# Patient Record
Sex: Male | Born: 1940 | Race: White | Hispanic: No | Smoking: Former smoker
Health system: Southern US, Community
[De-identification: ages and names within clinical notes are randomized; demographics above are authoritative.]

## PROBLEM LIST (undated history)

## (undated) DIAGNOSIS — C44309 Unspecified malignant neoplasm of skin of other parts of face: Secondary | ICD-10-CM

## (undated) DIAGNOSIS — K5790 Diverticulosis of intestine, part unspecified, without perforation or abscess without bleeding: Secondary | ICD-10-CM

## (undated) DIAGNOSIS — E785 Hyperlipidemia, unspecified: Secondary | ICD-10-CM

## (undated) DIAGNOSIS — I10 Essential (primary) hypertension: Secondary | ICD-10-CM

## (undated) HISTORY — PX: MOHS SURGERY: SHX181

## (undated) HISTORY — DX: Diverticulosis of intestine, part unspecified, without perforation or abscess without bleeding: K57.90

## (undated) HISTORY — DX: Essential (primary) hypertension: I10

## (undated) HISTORY — DX: Unspecified malignant neoplasm of skin of other parts of face: C44.309

## (undated) HISTORY — PX: KNEE SURGERY: SHX244

## (undated) HISTORY — DX: Hyperlipidemia, unspecified: E78.5

## (undated) HISTORY — PX: MIDDLE EAR SURGERY: SHX713

## (undated) HISTORY — PX: TONSILLECTOMY AND ADENOIDECTOMY: SHX28

---

## 1997-08-23 ENCOUNTER — Ambulatory Visit (HOSPITAL_BASED_OUTPATIENT_CLINIC_OR_DEPARTMENT_OTHER): Admission: RE | Admit: 1997-08-23 | Discharge: 1997-08-23 | Payer: Self-pay | Admitting: Plastic Surgery

## 2001-08-04 ENCOUNTER — Emergency Department (HOSPITAL_COMMUNITY): Admission: EM | Admit: 2001-08-04 | Discharge: 2001-08-04 | Payer: Self-pay | Admitting: Emergency Medicine

## 2006-04-08 ENCOUNTER — Ambulatory Visit: Payer: Self-pay | Admitting: Gastroenterology

## 2006-04-29 ENCOUNTER — Encounter (INDEPENDENT_AMBULATORY_CARE_PROVIDER_SITE_OTHER): Payer: Self-pay | Admitting: Specialist

## 2006-04-29 ENCOUNTER — Ambulatory Visit: Payer: Self-pay | Admitting: Gastroenterology

## 2007-03-02 ENCOUNTER — Encounter: Admission: RE | Admit: 2007-03-02 | Discharge: 2007-03-02 | Payer: Self-pay | Admitting: Internal Medicine

## 2008-08-14 ENCOUNTER — Ambulatory Visit (HOSPITAL_COMMUNITY): Admission: RE | Admit: 2008-08-14 | Discharge: 2008-08-14 | Payer: Self-pay | Admitting: Internal Medicine

## 2010-02-19 ENCOUNTER — Encounter (INDEPENDENT_AMBULATORY_CARE_PROVIDER_SITE_OTHER): Payer: Self-pay | Admitting: Internal Medicine

## 2010-02-19 ENCOUNTER — Ambulatory Visit (HOSPITAL_COMMUNITY)
Admission: RE | Admit: 2010-02-19 | Discharge: 2010-02-19 | Payer: Self-pay | Source: Home / Self Care | Attending: Internal Medicine | Admitting: Internal Medicine

## 2010-04-11 ENCOUNTER — Other Ambulatory Visit: Payer: Self-pay | Admitting: Otolaryngology

## 2010-08-26 ENCOUNTER — Other Ambulatory Visit: Payer: Self-pay | Admitting: Dermatology

## 2010-12-01 ENCOUNTER — Other Ambulatory Visit: Payer: Self-pay | Admitting: Dermatology

## 2011-03-06 ENCOUNTER — Encounter: Payer: Self-pay | Admitting: Gastroenterology

## 2011-03-17 ENCOUNTER — Encounter: Payer: Self-pay | Admitting: Gastroenterology

## 2011-03-31 ENCOUNTER — Ambulatory Visit (AMBULATORY_SURGERY_CENTER): Payer: Managed Care, Other (non HMO)

## 2011-03-31 VITALS — Ht 69.0 in | Wt 155.8 lb

## 2011-03-31 DIAGNOSIS — Z86010 Personal history of colonic polyps: Secondary | ICD-10-CM

## 2011-03-31 DIAGNOSIS — Z1211 Encounter for screening for malignant neoplasm of colon: Secondary | ICD-10-CM

## 2011-03-31 MED ORDER — PEG-KCL-NACL-NASULF-NA ASC-C 100 G PO SOLR: 1.0000 | Freq: Once | ORAL | Status: AC

## 2011-04-14 ENCOUNTER — Encounter: Payer: Self-pay | Admitting: Gastroenterology

## 2011-04-14 ENCOUNTER — Ambulatory Visit (AMBULATORY_SURGERY_CENTER): Payer: Medicare Other | Admitting: Gastroenterology

## 2011-04-14 DIAGNOSIS — Z86010 Personal history of colonic polyps: Secondary | ICD-10-CM

## 2011-04-14 DIAGNOSIS — D126 Benign neoplasm of colon, unspecified: Secondary | ICD-10-CM

## 2011-04-14 DIAGNOSIS — Z1211 Encounter for screening for malignant neoplasm of colon: Secondary | ICD-10-CM

## 2011-04-14 DIAGNOSIS — K5730 Diverticulosis of large intestine without perforation or abscess without bleeding: Secondary | ICD-10-CM

## 2011-04-14 MED ORDER — SODIUM CHLORIDE 0.9 % IV SOLN: 500.0000 mL | INTRAVENOUS | Status: DC

## 2011-04-14 NOTE — Patient Instructions (Signed)
YOU HAD AN ENDOSCOPIC PROCEDURE TODAY AT THE [facility]: Refer to the procedure report that was given to you for any specific questions about what was found during the examination.  If the procedure report does not answer your questions, please call your gastroenterologist to clarify.  If you requested that your care partner not be given the details of your procedure findings, then the procedure report has been included in a sealed envelope for you to review at your convenience later.  YOU SHOULD EXPECT: Some feelings of bloating in the abdomen. Passage of more gas than usual.  Walking can help get rid of the air that was put into your GI tract during the procedure and reduce the bloating. If you had a lower endoscopy (such as a colonoscopy or flexible sigmoidoscopy) you may notice spotting of blood in your stool or on the toilet paper. If you underwent a bowel prep for your procedure, then you may not have a normal bowel movement for a few days.  DIET: Your first meal following the procedure should be a light meal and then it is ok to progress to your normal diet.  A half-sandwich or bowl of soup is an example of a good first meal.  Heavy or fried foods are harder to digest and may make you feel nauseous or bloated.  Likewise meals heavy in dairy and vegetables can cause extra gas to form and this can also increase the bloating.  Drink plenty of fluids but you should avoid alcoholic beverages for 24 hours.  ACTIVITY: Your care partner should take you home directly after the procedure.  You should plan to take it easy, moving slowly for the rest of the day.  You can resume normal activity the day after the procedure however you should NOT DRIVE or use heavy machinery for 24 hours (because of the sedation medicines used during the test).    SYMPTOMS TO REPORT IMMEDIATELY: A gastroenterologist can be reached at any hour.  During normal business hours, 8:30 AM to 5:00 PM Monday through Friday,  call [phone].  After hours and on weekends, please call the GI answering service at [phone] who will take a message and have the physician on call contact you.   Following lower endoscopy (colonoscopy or flexible sigmoidoscopy):  Excessive amounts of blood in the stool  Significant tenderness or worsening of abdominal pains  Swelling of the abdomen that is new, acute  Fever of 100F or higher  Following upper endoscopy (EGD)  Vomiting of blood or coffee ground material  New chest pain or pain under the shoulder blades  Painful or persistently difficult swallowing  New shortness of breath  Fever of 100F or higher  Black, tarry-looking stools  FOLLOW UP: If any biopsies were taken you will be contacted by phone or by letter within the next 1-3 weeks.  Call your gastroenterologist if you have not heard about the biopsies in 3 weeks.  Our staff will call the home number listed on your records the next business day following your procedure to check on you and address any questions or concerns that you may have at that time regarding the information given to you following your procedure. This is a courtesy call and so if there is no answer at the home number and we have not heard from you through the emergency physician on call, we will assume that you have returned to your regular daily activities without incident.  SIGNATURES/CONFIDENTIALITY: You and/or your care   partner have signed paperwork which will be entered into your electronic medical record.  These signatures attest to the fact that that the information above on your After Visit Summary has been reviewed and is understood.  Full responsibility of the confidentiality of this discharge information lies with you and/or your care-partner.    INFORMATION ON POLYPS,DIVERTICULOSIS,& HIGH FIBER DIET GIVEN TO YOU TODAY  

## 2011-04-14 NOTE — Op Note (Signed)
[facility] [address] [place], [place]  [address]  COLONOSCOPY PROCEDURE REPORT  PATIENT:  Bryan Rivera, Bryan Rivera  MR#:  [id number] BIRTHDATE:  21-May-1940, 70 yrs. old  GENDER:  male ENDOSCOPIST:  Barbette Hair. Arlyce Dice, MD REF. BY:  Nila Nephew, M.D. PROCEDURE DATE:  04/14/2011 PROCEDURE:  Colon with cold biopsy polypectomy ASA CLASS:  Class II INDICATIONS:  Screening, history of pre-cancerous (adenomatous) colon polyps polyps 2005, 2008 MEDICATIONS:   MAC sedation, administered by CRNA propofol 330mg IV  DESCRIPTION OF PROCEDURE:   After the risks benefits and alternatives of the procedure were thoroughly explained, informed consent was obtained.  Digital rectal exam was performed and revealed no abnormalities.   The LB [id number] endoscope was introduced through the anus and advanced to the cecum, which was identified by both the appendix and ileocecal valve, without limitations.  The quality of the prep was good, using MoviPrep. The instrument was then slowly withdrawn as the colon was fully examined. <<PROCEDUREIMAGES>>  FINDINGS:  A diminutive polyp was found in the descending colon. It was 2 mm in size. The polyp was removed using cold biopsy forceps (see image6).  Mild diverticulosis was found in the ascending colon (see image4).  Moderate diverticulosis was found in the sigmoid colon (see image7).  This was otherwise a normal examination of the colon (see image2 and image8).   Retroflexed views in the rectum revealed no abnormalities.    The time to cecum =  1) 2.75  minutes. The scope was then withdrawn in  1) 16.75  minutes from the cecum and the procedure completed. COMPLICATIONS:  None ENDOSCOPIC IMPRESSION: 1) 2 mm diminutive polyp in the descending colon 2) Mild diverticulosis in the ascending colon 3) Moderate diverticulosis in the sigmoid colon 4) Otherwise normal examination RECOMMENDATIONS: 1) If the polyp(s) removed today are proven to be  adenomatous (pre-cancerous) polyps, you will need a repeat colonoscopy in 5 years. Otherwise you should continue to follow colorectal cancer screening guidelines for "routine risk" patients with colonoscopy in 10 years. You will receive a letter within 1-2 weeks with the results of your biopsy as well as final recommendations. Please call my office if you have not received a letter after 3 weeks. REPEAT EXAM:   You will receive a letter from Dr. Arlyce Dice in 1-2 weeks, after reviewing the final pathology, with followup recommendations.  ______________________________ Barbette Hair Arlyce Dice, MD  CC:  n. eSIGNED:   Barbette Hair. Anaira Seay at 04/14/2011 10:10 AM  Doristine Devoid, [phone]

## 2011-04-14 NOTE — Progress Notes (Signed)
Patient did not experience any of the following events: a burn prior to discharge; a fall within the facility; wrong site/side/patient/procedure/implant event; or a hospital transfer or hospital admission upon discharge from the facility. (G8907) Patient did not have preoperative order for IV antibiotic SSI prophylaxis. (G8918)  

## 2011-04-14 NOTE — Progress Notes (Signed)
Pt bruised immediately when IV started.  Tissue remains soft.  IV infusing without any problems.  Will continue to watch closely.

## 2011-04-15 ENCOUNTER — Telehealth: Payer: Self-pay | Admitting: *Deleted

## 2011-04-15 NOTE — Telephone Encounter (Signed)
  Follow up Call-  Call back number 04/14/2011  Post procedure Call Back phone  # [phone]  Permission to leave phone message Yes     Patient questions:  Do you have a fever, pain , or abdominal swelling? no Pain Score  0 *  Have you tolerated food without any problems? yes  Have you been able to return to your normal activities? yes  Do you have any questions about your discharge instructions: Diet   no Medications  no Follow up visit  no  Do you have questions or concerns about your Care? no  Actions: * If pain score is 4 or above: No action needed, pain <4.

## 2011-04-21 ENCOUNTER — Encounter: Payer: Self-pay | Admitting: Gastroenterology

## 2011-07-21 ENCOUNTER — Encounter (HOSPITAL_COMMUNITY): Payer: Self-pay | Admitting: *Deleted

## 2011-07-21 ENCOUNTER — Emergency Department (HOSPITAL_COMMUNITY)
Admission: EM | Admit: 2011-07-21 | Discharge: 2011-07-21 | Disposition: A | Discharge status: Home / Self Care | Payer: Medicare Other | Attending: Emergency Medicine | Admitting: Emergency Medicine

## 2011-07-21 ENCOUNTER — Emergency Department (HOSPITAL_COMMUNITY): Payer: Medicare Other

## 2011-07-21 DIAGNOSIS — E119 Type 2 diabetes mellitus without complications: Secondary | ICD-10-CM | POA: Insufficient documentation

## 2011-07-21 DIAGNOSIS — Z79899 Other long term (current) drug therapy: Secondary | ICD-10-CM | POA: Insufficient documentation

## 2011-07-21 DIAGNOSIS — R112 Nausea with vomiting, unspecified: Secondary | ICD-10-CM | POA: Insufficient documentation

## 2011-07-21 DIAGNOSIS — R109 Unspecified abdominal pain: Secondary | ICD-10-CM | POA: Insufficient documentation

## 2011-07-21 DIAGNOSIS — N200 Calculus of kidney: Secondary | ICD-10-CM | POA: Insufficient documentation

## 2011-07-21 DIAGNOSIS — I10 Essential (primary) hypertension: Secondary | ICD-10-CM | POA: Insufficient documentation

## 2011-07-21 DIAGNOSIS — K8020 Calculus of gallbladder without cholecystitis without obstruction: Secondary | ICD-10-CM | POA: Insufficient documentation

## 2011-07-21 DIAGNOSIS — Z85828 Personal history of other malignant neoplasm of skin: Secondary | ICD-10-CM | POA: Insufficient documentation

## 2011-07-21 DIAGNOSIS — E785 Hyperlipidemia, unspecified: Secondary | ICD-10-CM | POA: Insufficient documentation

## 2011-07-21 LAB — COMPREHENSIVE METABOLIC PANEL
ALT: 30 U/L (ref 0–53)
BUN: 27 mg/dL — ABNORMAL HIGH (ref 6–23)
CO2: 20 mEq/L (ref 19–32)
Calcium: 9.7 mg/dL (ref 8.4–10.5)
Creatinine, Ser: 1.24 mg/dL (ref 0.50–1.35)
GFR calc Af Amer: 66 mL/min — ABNORMAL LOW (ref >90)
GFR calc non Af Amer: 57 mL/min — ABNORMAL LOW (ref >90)
Glucose, Bld: 214 mg/dL — ABNORMAL HIGH (ref 70–99)
Sodium: 138 mEq/L (ref 135–145)

## 2011-07-21 LAB — DIFFERENTIAL
Eosinophils Relative: 1 % (ref 0–5)
Lymphocytes Relative: 4 % — ABNORMAL LOW (ref 12–46)
Lymphs Abs: 0.5 10*3/uL — ABNORMAL LOW (ref 0.7–4.0)
Monocytes Absolute: 0.7 10*3/uL (ref 0.1–1.0)
Monocytes Relative: 6 % (ref 3–12)

## 2011-07-21 LAB — CBC
HCT: 46.1 % (ref 39.0–52.0)
Hemoglobin: 16.1 g/dL (ref 13.0–17.0)
MCH: 31.6 pg (ref 26.0–34.0)
MCV: 90.6 fL (ref 78.0–100.0)
RBC: 5.09 MIL/uL (ref 4.22–5.81)
WBC: 11.6 10*3/uL — ABNORMAL HIGH (ref 4.0–10.5)

## 2011-07-21 LAB — URINALYSIS, ROUTINE W REFLEX MICROSCOPIC
Glucose, UA: NEGATIVE mg/dL
Hgb urine dipstick: NEGATIVE
Specific Gravity, Urine: 1.023 (ref 1.005–1.030)

## 2011-07-21 LAB — URINE MICROSCOPIC-ADD ON

## 2011-07-21 MED ORDER — CIPROFLOXACIN HCL 500 MG PO TABS: 500.0000 mg | ORAL_TABLET | Freq: Two times a day (BID) | ORAL | Status: AC

## 2011-07-21 MED ORDER — NAPROXEN 500 MG PO TABS: 500.0000 mg | ORAL_TABLET | Freq: Two times a day (BID) | ORAL | Status: AC

## 2011-07-21 MED ORDER — HYDROCODONE-ACETAMINOPHEN 5-500 MG PO TABS: 1.0000 | ORAL_TABLET | Freq: Four times a day (QID) | ORAL | Status: AC | PRN

## 2011-07-21 MED ORDER — METRONIDAZOLE 500 MG PO TABS: 500.0000 mg | ORAL_TABLET | Freq: Two times a day (BID) | ORAL | Status: AC

## 2011-07-21 MED ORDER — ONDANSETRON HCL 4 MG/2ML IJ SOLN
4.0000 mg | Freq: Four times a day (QID) | INTRAMUSCULAR | Status: DC | PRN
  Administered 2011-07-21: 4 mg via INTRAVENOUS
  Filled 2011-07-21: qty 2

## 2011-07-21 MED ORDER — IOHEXOL 300 MG/ML  SOLN
100.0000 mL | Freq: Once | INTRAMUSCULAR | Status: AC | PRN
  Administered 2011-07-21: 100 mL via INTRAVENOUS

## 2011-07-21 MED ORDER — SODIUM CHLORIDE 0.9 % IV SOLN
Freq: Once | INTRAVENOUS | Status: AC
  Administered 2011-07-21: 04:00:00 via INTRAVENOUS

## 2011-07-21 MED ORDER — MORPHINE SULFATE 4 MG/ML IJ SOLN
4.0000 mg | INTRAMUSCULAR | Status: DC | PRN
  Administered 2011-07-21: 4 mg via INTRAVENOUS
  Filled 2011-07-21: qty 1

## 2011-07-21 NOTE — ED Notes (Signed)
abd pain with nv and diarrhea for 3-4 hours

## 2011-07-21 NOTE — Discharge Instructions (Signed)
You have been diagnosed with undifferentiated abdominal pain.  Abdominal pain can be caused by many things. Your caregiver evaluates the seriousness of your pain by an examination and possibly blood or urine tests and imaging (CT scan, x-rays, ultrasound). Many cases can be observed and treated at home after initial evaluation in the emergency department. Even though you are being discharged home, abdominal pain can be unpredictable. Therefore, you need a repeat exam if your pain does not resolve, returns, or worsens. Most patient's with abdominal pain do not need to be admitted to the hospital or have surgery, but serious problems like appendicitis and gallbladder attacks can start out as nonspecific pain. Many abdominal conditions cannot be diagnosed in 1 visit, so followup evaluations are very important.  In your case the CT scan showed a small portion of your bowel that was surrounded by some inflammatory changes. We are unsure what caused this inflammation at this time but it is likely related to either infection or inflammation. If you should develop severe or worsening pain, fever or vomiting he should return to the emergency department immediately.  Seek immediate medical attention if:  *The pain does not go away or becomes severe. *Temperature above 101 develops *Repeated vomiting occurs(multiple episodes) *The pain becomes localized to portions of the abdomen. The right side could possibly be appendicitis. In an adult, the left lower portion of the abdomen could be colitis or diverticulitis. *Blood is being passed in stools or vomit *Return also if you develop chest pain, difficulty breathing, dizziness or fainting, or become confused poorly responsive or inconsolable (young children).

## 2011-07-21 NOTE — ED Provider Notes (Signed)
History     CSN: [id number]  Arrival date & time 07/21/11  0118   First MD Initiated Contact with Patient 07/21/11 0325      Chief Complaint  Patient presents with  . Abdominal Pain    (Consider location/radiation/quality/duration/timing/severity/associated sxs/prior treatment) HPI Comments: 71 year old male with a history of diabetes, hypertension, diverticulosis who presents with a complaint of abdominal pain. He states this was acute in onset several hours ago, associated with nausea and vomiting but no diarrhea. He states that the pain comes in waves, is severe when it comes on, resolves spontaneously but then recurs. He denies any recent abdominal trauma, fevers, chills, changes in bowel habits. He does admit to having a upper respiratory illness last week which has completely resolved. He has been eating and drinking without difficulty for the last 24 hours until this illness started. He denies any history of abdominal surgery  Patient is a 71 y.o. male presenting with abdominal pain. The history is provided by the patient and the spouse.  Abdominal Pain The primary symptoms of the illness include abdominal pain.    Past Medical History  Diagnosis Date  . Diabetes mellitus   . Hyperlipidemia   . Hypertension   . 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 Diverticulosis   . Hemorrhoids     Past Surgical History  Procedure Date  . Middle ear surgery     multiple times  . Tonsillectomy and adenoidectomy   . Knee surgery     No family history on file.  History  Substance Use Topics  . Smoking status: Former Smoker    Types: Cigarettes    Quit date: 03/30/1986  . Smokeless tobacco: Never Used  . Alcohol Use: 3.5 oz/week    7 drink(s) per week      Review of Systems  Gastrointestinal: Positive for abdominal pain.  All other systems reviewed and are negative.    Allergies  Sulfa antibiotics  Home Medications    Current Outpatient Rx  Name Route Sig Dispense Refill  . CETIRIZINE HCL 10 MG PO TABS Oral Take 10 mg by mouth daily as needed. As needed for allergies.    Marland Kitchen CLOBETASOL PROPIONATE 0.05 % EX FOAM Topical Apply 1 application topically daily.     Marland Kitchen METFORMIN HCL 850 MG PO TABS Oral Take 850 mg by mouth 2 (two) times daily with a meal.    . OVER THE COUNTER MEDICATION Oral Take 1 tablet by mouth 2 (two) times daily as needed. Allergy Relief. As needed for allergy relief.    Marland Kitchen RAMIPRIL 10 MG PO CAPS Oral Take 10 mg by mouth daily.    Marland Kitchen SIMVASTATIN 40 MG PO TABS Oral Take 40 mg by mouth every evening.    Marland Kitchen CIPROFLOXACIN HCL 500 MG PO TABS Oral Take 1 tablet (500 mg total) by mouth every 12 (twelve) hours. 20 tablet 0  . HYDROCODONE-ACETAMINOPHEN 5-500 MG PO TABS Oral Take 1-2 tablets by mouth every 6 (six) hours as needed for pain. 15 tablet 0  . METRONIDAZOLE 500 MG PO TABS Oral Take 1 tablet (500 mg total) by mouth 2 (two) times daily. 20 tablet 0  . NAPROXEN 500 MG PO TABS Oral Take 1 tablet (500 mg total) by mouth 2 (two) times daily with a meal. 30 tablet 0    BP 96/60  Pulse 79  Temp(Src) 97.7 F (36.5 C) (Oral)  Resp 14  SpO2  97%  Physical Exam  Nursing note and vitals reviewed. Constitutional: He appears well-developed and well-nourished. No distress.  HENT:  Head: Normocephalic and atraumatic.  Mouth/Throat: Oropharynx is clear and moist. No oropharyngeal exudate.  Eyes: Conjunctivae and EOM are normal. Pupils are equal, round, and reactive to light. Right eye exhibits no discharge. Left eye exhibits no discharge. No scleral icterus.  Neck: Normal range of motion. Neck supple. No JVD present. No thyromegaly present.  Cardiovascular: Normal rate, regular rhythm, normal heart sounds and intact distal pulses.  Exam reveals no gallop and no friction rub.   No murmur heard. Pulmonary/Chest: Effort normal and breath sounds normal. No respiratory distress. He has no wheezes. He has no  rales.  Abdominal: Soft. Bowel sounds are normal. He exhibits no distension and no mass. There is no tenderness.  Musculoskeletal: Normal range of motion. He exhibits no edema and no tenderness.  Lymphadenopathy:    He has no cervical adenopathy.  Neurological: He is alert. Coordination normal.  Skin: Skin is warm and dry. No rash noted. No erythema.  Psychiatric: He has a normal mood and affect. His behavior is normal.    ED Course  Procedures (including critical care time)  Labs Reviewed  URINALYSIS, ROUTINE W REFLEX MICROSCOPIC - Abnormal; Notable for the following:    APPearance CLOUDY (*)    Bilirubin Urine MODERATE (*)    Ketones, ur 40 (*)    Protein, ur 30 (*)    All other components within normal limits  CBC - Abnormal; Notable for the following:    WBC 11.6 (*)    All other components within normal limits  DIFFERENTIAL - Abnormal; Notable for the following:    Neutrophils Relative 89 (*)    Neutro Abs 10.3 (*)    Lymphocytes Relative 4 (*)    Lymphs Abs 0.5 (*)    All other components within normal limits  COMPREHENSIVE METABOLIC PANEL - Abnormal; Notable for the following:    Potassium 5.8 (*)    Glucose, Bld 214 (*)    BUN 27 (*)    AST 40 (*)    GFR calc non Af Amer 57 (*)    GFR calc Af Amer 66 (*)    All other components within normal limits  URINE MICROSCOPIC-ADD ON - Abnormal; Notable for the following:    Bacteria, UA FEW (*)    Casts HYALINE CASTS (*)    Crystals CA OXALATE CRYSTALS (*)    All other components within normal limits  LIPASE, BLOOD   Ct Abdomen Pelvis W Contrast  07/21/2011  *RADIOLOGY REPORT*  Clinical Data: Upper abdominal pain.  CT ABDOMEN AND PELVIS WITH CONTRAST  Technique:  Multidetector CT imaging of the abdomen and pelvis was performed following the standard protocol during bolus administration of intravenous contrast.  Contrast: OMNIPAQUE IOHEXOL 300 MG/ML  SOLN  Comparison: None.  Findings: Limited images through the lung  bases demonstrate no significant appreciable abnormality. The heart size is within normal limits. No pleural or pericardial effusion.  Coronary artery calcification.  Diffuse low attenuation of the liver is the most in keeping with fatty infiltration.  Small layering gallstones.  No gallbladder wall thickening or pericholecystic fluid.  No biliary ductal dilatation.  Unremarkable spleen and adrenal glands.  Punctate calcific density along the tail of pancreas may reflect a vascular calcification or sequelae of prior pancreatitis.  There is prominence of the main pancreatic duct within the head, measuring up to 5 mm.  No obstructing  lesion is identified.  There is a small duodenal diverticulum.  Multiple bilateral nonobstructing renal stones.  No hydronephrosis or hydroureter.  Colonic diverticulosis.  No CT evidence for colitis or diverticulitis.  No bowel obstruction.  There are a few loops of jejunum with mild mesenteric edema/fat stranding.  No overt bowel wall thickening (see coronal image 24 as index).  No free intraperitoneal air.  No lymphadenopathy.  Small amount of free fluid collecting perihepatic and dependently within the pelvis.  Circumferential bladder wall thickening is nonspecific given incomplete distension. Small fat containing left inguinal hernia.  Advanced atherosclerotic disease of the aorta and branch vessels.  Multilevel degenerative changes of the imaged spine. No acute or aggressive appearing osseous lesion. L5 pars defects.  IMPRESSION:  Mild mesenteric stranding/interloop fluid abutting a decompressed loop of jejunum.  A nonspecific enteritis is not excluded (infectious, inflammatory, ischemic etiologies as considerations).  Hepatic steatosis.  Small amount of perihepatic and dependent pelvic fluid.  Circumferential bladder wall thickening is nonspecific given incomplete distension.  Correlate with urinalysis to exclude cystitis.  Nonobstructing renal stones.  Small gallstones.  No  gallbladder wall thickening or pericholecystic fluid.  Nonspecific pancreatic ductal prominence within the head.  No obstructing lesion identified.  Correlate with prior imaging if available.  Alternatively, can be further characterized with ERCP or MRCP.  Original Report Authenticated By: Waneta Martins, M.D.     1. Abdominal pain       MDM  On my initial examination there does not appear to be any tenderness. Will obtain EKG, CT scan of the abdomen and pelvis, lab work at this time shows a leukocytosis of 11,600, potassium of 5.8 and liver function tests which are normal. Urinalysis shows ketones likely related to dehydration but no glucosuria.  ED ECG REPORT   Date: 07/21/2011 I have personally reviewed the EKG  Rate: 77  Rhythm: normal sinus rhythm  QRS Axis: normal  Intervals: normal  ST/T Wave abnormalities: normal  Conduction Disutrbances:none  Narrative Interpretation:   Old EKG Reviewed: none available  I have reviewed the patient's labs with him, his CT scan and have informed of all his results. He states that he has no pain since the initial evaluation. The CT scan shows a small amount of inflammation surrounding part of the bowel and after repeat evaluation showing no abdominal tenderness, overall reassuring laboratory data including a white blood cell count of 11,600.  The patient has been given IV fluids for his urinalysis was 40+ ketones. He states that he feels stable for discharge, he is able to express his understanding of indications for followup.  Discharge Prescriptions include:  Naprosyn Hydrocodone Ciprofloxacin Flagyl   Vida Roller, MD 07/21/11 [phone]

## 2011-12-22 ENCOUNTER — Ambulatory Visit: Payer: Medicare Other | Admitting: Psychology

## 2012-04-13 ENCOUNTER — Other Ambulatory Visit: Payer: Self-pay | Admitting: Dermatology

## 2012-05-26 ENCOUNTER — Other Ambulatory Visit: Payer: Self-pay | Admitting: Dermatology

## 2012-10-27 ENCOUNTER — Other Ambulatory Visit: Payer: Self-pay | Admitting: Dermatology

## 2013-03-03 ENCOUNTER — Encounter (INDEPENDENT_AMBULATORY_CARE_PROVIDER_SITE_OTHER): Payer: Self-pay | Admitting: General Surgery

## 2013-03-03 ENCOUNTER — Ambulatory Visit (INDEPENDENT_AMBULATORY_CARE_PROVIDER_SITE_OTHER): Payer: Medicare Other | Admitting: General Surgery

## 2013-03-03 VITALS — BP 140/80 | HR 120 | Temp 98.2°F | Resp 14 | Ht 69.0 in | Wt 162.4 lb

## 2013-03-03 DIAGNOSIS — K8020 Calculus of gallbladder without cholecystitis without obstruction: Secondary | ICD-10-CM

## 2013-03-03 DIAGNOSIS — R7989 Other specified abnormal findings of blood chemistry: Secondary | ICD-10-CM

## 2013-03-03 DIAGNOSIS — R945 Abnormal results of liver function studies: Secondary | ICD-10-CM

## 2013-03-03 NOTE — Progress Notes (Signed)
Subjective:   abdominal pain, gallstones  Patient ID: Bryan Rivera, male   DOB: 12/04/1940, 72 y.o.   MRN: [id number]  HPI Patient is a very pleasant 72-year-old male referred by Dr. Arthur Green. He was in his usual state of health until one week ago when while driving to [place] to see his daughter he developed the onset of upper abdominal pressure which over the next hour or so developed and a significant pressure-like diffuse upper abdominal pain. This was associated with nausea and vomiting. His symptoms worsened and they went to the emergency room in [place]. There he had a workup including lab work, CT scan and ultrasound of the abdomen. I have all this to review today.CT scan showed some fatty infiltration of the liver, mildly prominent common bile duct at 9 mm without filling defects. Ultrasound showed gallbladder sludge and probable tiny stones without biliary ductal dilatation. Of note is the patient had a similar episode of pain in 2013 and CT scan of the abdomen and pelvis was done at that time revealing small gallstones. His pain resolved quickly in the emergency room one week ago after pain medication and since then he has felt well with no abdominal pain or nausea, fever, jaundice or other complaints.  Past Medical History  Diagnosis Date  . Diabetes mellitus   . Hyperlipidemia   . Hypertension   . 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 Diverticulosis   . Hemorrhoids    Past Surgical History  Procedure Laterality Date  . Middle ear surgery      multiple times  . Tonsillectomy and adenoidectomy    . Knee surgery     Current Outpatient Prescriptions  Medication Sig Dispense Refill  . cetirizine (ZYRTEC) 10 MG tablet Take 10 mg by mouth daily as needed. As needed for allergies.      . clobetasol (OLUX) 0.05 % topical foam Apply 1 application topically daily.       . metFORMIN (GLUCOPHAGE) 850 MG tablet Take 850 mg by mouth 2 (two)  times daily with a meal.      . ONGLYZA 5 MG TABS tablet       . OVER THE COUNTER MEDICATION Take 1 tablet by mouth 2 (two) times daily as needed. Allergy Relief. As needed for allergy relief.      . ramipril (ALTACE) 10 MG capsule Take 10 mg by mouth daily.      . simvastatin (ZOCOR) 40 MG tablet Take 40 mg by mouth every evening.      . HYDROcodone-acetaminophen (NORCO/VICODIN) 5-325 MG per tablet       . ondansetron (ZOFRAN-ODT) 4 MG disintegrating tablet        No current facility-administered medications for this visit.   Allergies  Allergen Reactions  . Sulfa Antibiotics Swelling    Review of Systems Gen.: No fever or chills or malaise Lungs: Denies shortness of breath cough wheezing Cardiac: Denies chest pain, palpitations or history of heart disease Abdomen: As above GU: No hematuria Musculoskeletal: Chronic joint pain and arthritis    Objective:   Physical Exam BP 140/80  Pulse 120  Temp(Src) 98.2 F (36.8 C) (Temporal)  Resp 14  Ht 5' 9" (1.753 m)  Wt 162 lb 6.4 oz (73.664 kg)  BMI 23.97 kg/m2 General: Alert,  thin, slightly anxious Caucasian male in no distress Skin: Warm and dry. Mild papular rash over her trunk. HEENT: No palpable masses or   thyromegaly. Sclera nonicteric. Pupils equal round and reactive. Oropharynx clear. Lymph nodes: No cervical, supraclavicular, or inguinal nodes palpable. Lungs: Breath sounds clear and equal without increased work of breathing Cardiovascular: Regular rate and rhythm without murmur. No JVD or edema. Peripheral pulses intact. Abdomen: Nondistended. Soft and nontender. No masses palpable. No organomegaly. No palpable hernias. Extremities: No edema or joint swelling or deformity. No chronic venous stasis changes. Neurologic: Alert and fully oriented. Gait normal.    Assessment:     Recent episode of severe epigastric abdominal pain and vomiting. He also had a similar episode in 2013. He has small gallstones and chemical  evidence of gallstone pancreatitis with mildly elevated LFTs. He is currently in tightly asymptomatic. I suspect he passed a common bile duct stone. I have recommended proceeding with laparoscopic cholecystectomy with cholangiogram to prevent further symptoms and prevent complications from his gallstones.I discussed the procedure in detail.  The patient was given Neurosurgeon.  We discussed the risks and benefits of a laparoscopic cholecystectomy and possible cholangiogram including, but not limited to bleeding, infection, injury to surrounding structures such as the intestine or liver, bile leak, retained gallstones, need to convert to an open procedure, prolonged diarrhea, blood clots such as  DVT, common bile duct injury, anesthesia risks, and possible need for additional procedures.  The likelihood of improvement in symptoms and return to the patient's normal status is good. We discussed the typical post-operative recovery course.     Plan:     Laparoscopic cholecystectomy with cholangiogram under general anesthesia likely as an outpatient. Repeat LFTs and lipase preoperatively.

## 2013-03-03 NOTE — Patient Instructions (Signed)
Cholelithiasis °Cholelithiasis (also called gallstones) is a form of gallbladder disease in which gallstones form in your gallbladder. The gallbladder is an organ that stores bile made in the liver, which helps digest fats. Gallstones begin as small crystals and slowly grow into stones. Gallstone pain occurs when the gallbladder spasms and a gallstone is blocking the duct. Pain can also occur when a stone passes out of the duct.  °RISK FACTORS °· Being male.   °· Having multiple pregnancies. Health care providers sometimes advise removing diseased gallbladders before future pregnancies.   °· Being obese. °· Eating a diet heavy in fried foods and fat.   °· Being older than 60 years and increasing age.   °· Prolonged use of medicines containing male hormones.   °· Having diabetes mellitus.   °· Rapidly losing weight.   °· Having a family history of gallstones (heredity).   °SYMPTOMS °· Nausea.   °· Vomiting. °· Abdominal pain.   °· Yellowing of the skin (jaundice).   °· Sudden pain. It may persist from several minutes to several hours. °· Fever.   °· Tenderness to the touch.  °In some cases, when gallstones do not move into the bile duct, people have no pain or symptoms. These are called "silent" gallstones.  °TREATMENT °Silent gallstones do not need treatment. In severe cases, emergency surgery may be required. Options for treatment include: °· Surgery to remove the gallbladder. This is the most common treatment. °· Medicines. These do not always work and may take 6 12 months or more to work. °· Shock wave treatment (extracorporeal biliary lithotripsy). In this treatment an ultrasound machine sends shock waves to the gallbladder to break gallstones into smaller pieces that can pass into the intestines or be dissolved by medicine. °HOME CARE INSTRUCTIONS  °· Only take over-the-counter or prescription medicines for pain, discomfort, or fever as directed by your health care provider.   °· Follow a low-fat diet until  seen again by your health care provider. Fat causes the gallbladder to contract, which can result in pain.   °· Follow up with your health care provider as directed. Attacks are almost always recurrent and surgery is usually required for permanent treatment.   °SEEK IMMEDIATE MEDICAL CARE IF:  °· Your pain increases and is not controlled by medicines.   °· You have a fever or persistent symptoms for more than 2 3 days.   °· You have a fever and your symptoms suddenly get worse.   °· You have persistent nausea and vomiting.   °MAKE SURE YOU:  °· Understand these instructions. °· Will watch your condition. °· Will get help right away if you are not doing well or get worse. °Document Released: 01/22/2005 Document Revised: 09/28/2012 Document Reviewed: 07/20/2012 °Exit[employer]® Patient Information ©2014 [employer]. ° °

## 2013-03-06 ENCOUNTER — Encounter (HOSPITAL_COMMUNITY): Payer: Self-pay | Admitting: *Deleted

## 2013-03-06 ENCOUNTER — Telehealth (INDEPENDENT_AMBULATORY_CARE_PROVIDER_SITE_OTHER): Payer: Self-pay | Admitting: General Surgery

## 2013-03-06 ENCOUNTER — Encounter (INDEPENDENT_AMBULATORY_CARE_PROVIDER_SITE_OTHER): Payer: Self-pay

## 2013-03-06 ENCOUNTER — Encounter (HOSPITAL_COMMUNITY): Payer: Self-pay | Admitting: Pharmacy Technician

## 2013-03-06 NOTE — Telephone Encounter (Signed)
Pt called in to ask about how soon he can drive following his lap chole.  Instructed pt to count on 2 weeks at a minimum to drive again.  He must meet 2 important criteria to drive:  1) not taking any narcotics for pain, as that is DUI, and 2) must wear a seat belt for safety.  Also reminded him not to push, pull, lift or carry anything heavy (>15 lbs) during this period as well.  He understands all.

## 2013-03-07 ENCOUNTER — Encounter (HOSPITAL_COMMUNITY): Payer: Medicare Other | Admitting: Anesthesiology

## 2013-03-07 ENCOUNTER — Ambulatory Visit (HOSPITAL_COMMUNITY): Payer: Medicare Other | Admitting: Anesthesiology

## 2013-03-07 ENCOUNTER — Ambulatory Visit (HOSPITAL_COMMUNITY): Payer: Medicare Other

## 2013-03-07 ENCOUNTER — Encounter (HOSPITAL_COMMUNITY)
Admission: RE | Disposition: A | Discharge status: Home / Self Care | Payer: Self-pay | Source: Ambulatory Visit | Attending: General Surgery

## 2013-03-07 ENCOUNTER — Observation Stay (HOSPITAL_COMMUNITY)
Admission: RE | Admit: 2013-03-07 | Discharge: 2013-03-08 | Disposition: A | Discharge status: Home / Self Care | Payer: Medicare Other | Source: Ambulatory Visit | Attending: General Surgery | Admitting: General Surgery

## 2013-03-07 ENCOUNTER — Encounter (HOSPITAL_COMMUNITY): Payer: Self-pay | Admitting: *Deleted

## 2013-03-07 DIAGNOSIS — E785 Hyperlipidemia, unspecified: Secondary | ICD-10-CM | POA: Insufficient documentation

## 2013-03-07 DIAGNOSIS — I10 Essential (primary) hypertension: Secondary | ICD-10-CM | POA: Insufficient documentation

## 2013-03-07 DIAGNOSIS — K8020 Calculus of gallbladder without cholecystitis without obstruction: Principal | ICD-10-CM | POA: Insufficient documentation

## 2013-03-07 DIAGNOSIS — K8010 Calculus of gallbladder with chronic cholecystitis without obstruction: Secondary | ICD-10-CM

## 2013-03-07 DIAGNOSIS — R7401 Elevation of levels of liver transaminase levels: Secondary | ICD-10-CM | POA: Insufficient documentation

## 2013-03-07 DIAGNOSIS — E119 Type 2 diabetes mellitus without complications: Secondary | ICD-10-CM | POA: Insufficient documentation

## 2013-03-07 DIAGNOSIS — K859 Acute pancreatitis without necrosis or infection, unspecified: Secondary | ICD-10-CM | POA: Insufficient documentation

## 2013-03-07 DIAGNOSIS — Z87891 Personal history of nicotine dependence: Secondary | ICD-10-CM | POA: Insufficient documentation

## 2013-03-07 DIAGNOSIS — Z85828 Personal history of other malignant neoplasm of skin: Secondary | ICD-10-CM | POA: Insufficient documentation

## 2013-03-07 DIAGNOSIS — R7402 Elevation of levels of lactic acid dehydrogenase (LDH): Secondary | ICD-10-CM | POA: Insufficient documentation

## 2013-03-07 DIAGNOSIS — M129 Arthropathy, unspecified: Secondary | ICD-10-CM | POA: Insufficient documentation

## 2013-03-07 DIAGNOSIS — Z79899 Other long term (current) drug therapy: Secondary | ICD-10-CM | POA: Insufficient documentation

## 2013-03-07 DIAGNOSIS — R740 Nonspecific elevation of levels of transaminase and lactic acid dehydrogenase [LDH]: Secondary | ICD-10-CM

## 2013-03-07 HISTORY — PX: CHOLECYSTECTOMY: SHX55

## 2013-03-07 LAB — CBC WITH DIFFERENTIAL/PLATELET
Basophils Absolute: 0 10*3/uL (ref 0.0–0.1)
Basophils Relative: 1 % (ref 0–1)
EOS ABS: 0.2 10*3/uL (ref 0.0–0.7)
Eosinophils Relative: 4 % (ref 0–5)
HCT: 39.9 % (ref 39.0–52.0)
HEMOGLOBIN: 13.8 g/dL (ref 13.0–17.0)
Lymphocytes Relative: 32 % (ref 12–46)
Lymphs Abs: 1.5 10*3/uL (ref 0.7–4.0)
MCH: 32.1 pg (ref 26.0–34.0)
MCHC: 34.6 g/dL (ref 30.0–36.0)
MCV: 92.8 fL (ref 78.0–100.0)
MONOS PCT: 12 % (ref 3–12)
Monocytes Absolute: 0.6 10*3/uL (ref 0.1–1.0)
NEUTROS PCT: 52 % (ref 43–77)
Neutro Abs: 2.5 10*3/uL (ref 1.7–7.7)
PLATELETS: 220 10*3/uL (ref 150–400)
RBC: 4.3 MIL/uL (ref 4.22–5.81)
RDW: 13 % (ref 11.5–15.5)
WBC: 4.8 10*3/uL (ref 4.0–10.5)

## 2013-03-07 LAB — COMPREHENSIVE METABOLIC PANEL
ALK PHOS: 58 U/L (ref 39–117)
ALT: 52 U/L (ref 0–53)
AST: 63 U/L — ABNORMAL HIGH (ref 0–37)
Albumin: 4 g/dL (ref 3.5–5.2)
BILIRUBIN TOTAL: 0.5 mg/dL (ref 0.3–1.2)
BUN: 15 mg/dL (ref 6–23)
CHLORIDE: 102 meq/L (ref 96–112)
CO2: 18 mEq/L — ABNORMAL LOW (ref 19–32)
Calcium: 9.2 mg/dL (ref 8.4–10.5)
Creatinine, Ser: 1.23 mg/dL (ref 0.50–1.35)
GFR calc non Af Amer: 57 mL/min — ABNORMAL LOW (ref >90)
GFR, EST AFRICAN AMERICAN: 66 mL/min — AB (ref >90)
Glucose, Bld: 134 mg/dL — ABNORMAL HIGH (ref 70–99)
POTASSIUM: 4.5 meq/L (ref 3.7–5.3)
SODIUM: 138 meq/L (ref 137–147)
TOTAL PROTEIN: 7 g/dL (ref 6.0–8.3)

## 2013-03-07 LAB — LIPASE, BLOOD: Lipase: 592 U/L — ABNORMAL HIGH (ref 11–59)

## 2013-03-07 LAB — GLUCOSE, CAPILLARY
GLUCOSE-CAPILLARY: 171 mg/dL — AB (ref 70–99)
Glucose-Capillary: 161 mg/dL — ABNORMAL HIGH (ref 70–99)

## 2013-03-07 SURGERY — LAPAROSCOPIC CHOLECYSTECTOMY WITH INTRAOPERATIVE CHOLANGIOGRAM
Anesthesia: General | Site: Abdomen

## 2013-03-07 MED ORDER — FENTANYL CITRATE 0.05 MG/ML IJ SOLN
INTRAMUSCULAR | Status: DC | PRN
  Administered 2013-03-07 (×2): 50 ug via INTRAVENOUS
  Administered 2013-03-07: 100 ug via INTRAVENOUS
  Administered 2013-03-07: 50 ug via INTRAVENOUS
  Administered 2013-03-07: 100 ug via INTRAVENOUS
  Administered 2013-03-07 (×2): 50 ug via INTRAVENOUS
  Administered 2013-03-07: 100 ug via INTRAVENOUS

## 2013-03-07 MED ORDER — LABETALOL HCL 5 MG/ML IV SOLN
INTRAVENOUS | Status: AC
  Filled 2013-03-07: qty 4

## 2013-03-07 MED ORDER — PROPOFOL 10 MG/ML IV BOLUS
INTRAVENOUS | Status: DC | PRN
  Administered 2013-03-07: 20 mg via INTRAVENOUS
  Administered 2013-03-07: 180 mg via INTRAVENOUS

## 2013-03-07 MED ORDER — LACTATED RINGERS IV SOLN
INTRAVENOUS | Status: DC | PRN
  Administered 2013-03-07: 1000 mL via INTRAVENOUS

## 2013-03-07 MED ORDER — CHLORHEXIDINE GLUCONATE 4 % EX LIQD: 1.0000 "application " | Freq: Once | CUTANEOUS | Status: DC

## 2013-03-07 MED ORDER — BUPIVACAINE-EPINEPHRINE 0.25% -1:200000 IJ SOLN
INTRAMUSCULAR | Status: AC
  Filled 2013-03-07: qty 1

## 2013-03-07 MED ORDER — RAMIPRIL 10 MG PO CAPS
10.0000 mg | ORAL_CAPSULE | Freq: Every morning | ORAL | Status: DC
  Filled 2013-03-07: qty 1

## 2013-03-07 MED ORDER — BUPIVACAINE-EPINEPHRINE 0.25% -1:200000 IJ SOLN
INTRAMUSCULAR | Status: DC | PRN
  Administered 2013-03-07: 30 mL

## 2013-03-07 MED ORDER — 0.9 % SODIUM CHLORIDE (POUR BTL) OPTIME
TOPICAL | Status: DC | PRN
  Administered 2013-03-07: 1000 mL

## 2013-03-07 MED ORDER — ROCURONIUM BROMIDE 100 MG/10ML IV SOLN
INTRAVENOUS | Status: AC
  Filled 2013-03-07: qty 1

## 2013-03-07 MED ORDER — HYDROCODONE-ACETAMINOPHEN 5-325 MG PO TABS
1.0000 | ORAL_TABLET | Freq: Four times a day (QID) | ORAL | Status: DC | PRN
Start: 2013-03-07 — End: 2013-03-08

## 2013-03-07 MED ORDER — SIMVASTATIN 40 MG PO TABS
40.0000 mg | ORAL_TABLET | Freq: Every evening | ORAL | Status: DC
  Administered 2013-03-07: 40 mg via ORAL
  Filled 2013-03-07 (×2): qty 1

## 2013-03-07 MED ORDER — HYDROMORPHONE HCL PF 1 MG/ML IJ SOLN
0.2500 mg | INTRAMUSCULAR | Status: DC | PRN
  Administered 2013-03-07 (×3): 0.5 mg via INTRAVENOUS

## 2013-03-07 MED ORDER — GLYCOPYRROLATE 0.2 MG/ML IJ SOLN
INTRAMUSCULAR | Status: AC
  Filled 2013-03-07: qty 3

## 2013-03-07 MED ORDER — MORPHINE SULFATE 2 MG/ML IJ SOLN
2.0000 mg | INTRAMUSCULAR | Status: DC | PRN
  Administered 2013-03-07: 2 mg via INTRAVENOUS
  Filled 2013-03-07: qty 1

## 2013-03-07 MED ORDER — ONDANSETRON HCL 4 MG/2ML IJ SOLN
INTRAMUSCULAR | Status: DC | PRN
  Administered 2013-03-07: 4 mg via INTRAVENOUS

## 2013-03-07 MED ORDER — ONDANSETRON HCL 4 MG PO TABS: 4.0000 mg | ORAL_TABLET | Freq: Four times a day (QID) | ORAL | Status: DC | PRN

## 2013-03-07 MED ORDER — EPHEDRINE SULFATE 50 MG/ML IJ SOLN
INTRAMUSCULAR | Status: DC | PRN
  Administered 2013-03-07: 5 mg via INTRAVENOUS
  Administered 2013-03-07 (×2): 10 mg via INTRAVENOUS

## 2013-03-07 MED ORDER — NEOSTIGMINE METHYLSULFATE 1 MG/ML IJ SOLN
INTRAMUSCULAR | Status: AC
  Filled 2013-03-07: qty 10

## 2013-03-07 MED ORDER — SUCCINYLCHOLINE CHLORIDE 20 MG/ML IJ SOLN
INTRAMUSCULAR | Status: DC | PRN
  Administered 2013-03-07: 100 mg via INTRAVENOUS
  Administered 2013-03-07: 40 mg via INTRAVENOUS

## 2013-03-07 MED ORDER — LIDOCAINE HCL (CARDIAC) 20 MG/ML IV SOLN
INTRAVENOUS | Status: DC | PRN
  Administered 2013-03-07: 70 mg via INTRAVENOUS

## 2013-03-07 MED ORDER — IOHEXOL 300 MG/ML  SOLN
INTRAMUSCULAR | Status: DC | PRN
  Administered 2013-03-07: 50 mL via INTRAVENOUS

## 2013-03-07 MED ORDER — POTASSIUM CHLORIDE IN NACL 20-0.9 MEQ/L-% IV SOLN
INTRAVENOUS | Status: AC
  Filled 2013-03-07: qty 1000

## 2013-03-07 MED ORDER — HYDROMORPHONE HCL PF 1 MG/ML IJ SOLN
INTRAMUSCULAR | Status: AC
  Filled 2013-03-07: qty 1

## 2013-03-07 MED ORDER — INSULIN ASPART 100 UNIT/ML ~~LOC~~ SOLN
0.0000 [IU] | Freq: Three times a day (TID) | SUBCUTANEOUS | Status: DC
Start: 2013-03-08 — End: 2013-03-08
  Administered 2013-03-08: 09:00:00 via SUBCUTANEOUS

## 2013-03-07 MED ORDER — ONDANSETRON 4 MG PO TBDP
4.0000 mg | ORAL_TABLET | Freq: Three times a day (TID) | ORAL | Status: DC | PRN
  Filled 2013-03-07: qty 1

## 2013-03-07 MED ORDER — FENTANYL CITRATE 0.05 MG/ML IJ SOLN
INTRAMUSCULAR | Status: AC
  Filled 2013-03-07: qty 5

## 2013-03-07 MED ORDER — CEFAZOLIN SODIUM-DEXTROSE 2-3 GM-% IV SOLR
INTRAVENOUS | Status: AC
  Filled 2013-03-07: qty 50

## 2013-03-07 MED ORDER — ONDANSETRON HCL 4 MG/2ML IJ SOLN
4.0000 mg | Freq: Four times a day (QID) | INTRAMUSCULAR | Status: DC | PRN
  Filled 2013-03-07: qty 2

## 2013-03-07 MED ORDER — FENTANYL CITRATE 0.05 MG/ML IJ SOLN
INTRAMUSCULAR | Status: AC
Start: 2013-03-07 — End: 2013-03-07
  Filled 2013-03-07: qty 2

## 2013-03-07 MED ORDER — PROPOFOL 10 MG/ML IV BOLUS
INTRAVENOUS | Status: AC
  Filled 2013-03-07: qty 20

## 2013-03-07 MED ORDER — LACTATED RINGERS IV SOLN
INTRAVENOUS | Status: DC
  Administered 2013-03-07: 1000 mL via INTRAVENOUS
  Administered 2013-03-07: 15:00:00 via INTRAVENOUS

## 2013-03-07 MED ORDER — GLYCOPYRROLATE 0.2 MG/ML IJ SOLN
INTRAMUSCULAR | Status: DC | PRN
  Administered 2013-03-07: 0.6 mg via INTRAVENOUS

## 2013-03-07 MED ORDER — ONDANSETRON HCL 4 MG/2ML IJ SOLN
INTRAMUSCULAR | Status: AC
  Filled 2013-03-07: qty 2

## 2013-03-07 MED ORDER — METOPROLOL TARTRATE 1 MG/ML IV SOLN
INTRAVENOUS | Status: AC
  Filled 2013-03-07: qty 5

## 2013-03-07 MED ORDER — ROCURONIUM BROMIDE 100 MG/10ML IV SOLN
INTRAVENOUS | Status: DC | PRN
  Administered 2013-03-07: 30 mg via INTRAVENOUS
  Administered 2013-03-07: 10 mg via INTRAVENOUS

## 2013-03-07 MED ORDER — LIDOCAINE HCL (CARDIAC) 20 MG/ML IV SOLN
INTRAVENOUS | Status: AC
  Filled 2013-03-07: qty 5

## 2013-03-07 MED ORDER — PROMETHAZINE HCL 25 MG/ML IJ SOLN: 6.2500 mg | INTRAMUSCULAR | Status: DC | PRN

## 2013-03-07 MED ORDER — CEFAZOLIN SODIUM-DEXTROSE 2-3 GM-% IV SOLR
2.0000 g | INTRAVENOUS | Status: AC
  Administered 2013-03-07: 2 g via INTRAVENOUS

## 2013-03-07 MED ORDER — NEOSTIGMINE METHYLSULFATE 1 MG/ML IJ SOLN
INTRAMUSCULAR | Status: DC | PRN
  Administered 2013-03-07: 4 mg via INTRAVENOUS

## 2013-03-07 MED ORDER — LABETALOL HCL 5 MG/ML IV SOLN
INTRAVENOUS | Status: DC | PRN
  Administered 2013-03-07: 5 mg via INTRAVENOUS

## 2013-03-07 MED ORDER — POTASSIUM CHLORIDE IN NACL 20-0.9 MEQ/L-% IV SOLN
INTRAVENOUS | Status: DC
  Administered 2013-03-07 – 2013-03-08 (×2): via INTRAVENOUS
  Filled 2013-03-07 (×2): qty 1000

## 2013-03-07 SURGICAL SUPPLY — 47 items
ADH SKN CLS APL DERMABOND .7 (GAUZE/BANDAGES/DRESSINGS) ×1
APPLIER CLIP ROT 10 11.4 M/L (STAPLE) ×3
APR CLP MED LRG 11.4X10 (STAPLE) ×1
BAG SPEC RTRVL LRG 6X4 10 (ENDOMECHANICALS) ×1
BIOPATCH WHT 1IN DISK W/4.0 H (GAUZE/BANDAGES/DRESSINGS) ×2 IMPLANT
CANISTER SUCTION 2500CC (MISCELLANEOUS) ×1 IMPLANT
CATH REDDICK CHOLANGI 4FR 50CM (CATHETERS) IMPLANT
CHLORAPREP W/TINT 26ML (MISCELLANEOUS) ×3 IMPLANT
CLIP APPLIE ROT 10 11.4 M/L (STAPLE) ×1 IMPLANT
COVER MAYO STAND STRL (DRAPES) ×3 IMPLANT
DECANTER SPIKE VIAL GLASS SM (MISCELLANEOUS) ×3 IMPLANT
DERMABOND ADVANCED (GAUZE/BANDAGES/DRESSINGS) ×2
DERMABOND ADVANCED .7 DNX12 (GAUZE/BANDAGES/DRESSINGS) ×1 IMPLANT
DRAIN CHANNEL 19F RND (DRAIN) ×2 IMPLANT
DRAPE C-ARM 42X120 X-RAY (DRAPES) ×3 IMPLANT
DRAPE LAPAROSCOPIC ABDOMINAL (DRAPES) ×3 IMPLANT
DRAPE UTILITY XL STRL (DRAPES) ×3 IMPLANT
DRSG TEGADERM 2-3/8X2-3/4 SM (GAUZE/BANDAGES/DRESSINGS) ×2 IMPLANT
ELECT REM PT RETURN 9FT ADLT (ELECTROSURGICAL) ×3
ELECTRODE REM PT RTRN 9FT ADLT (ELECTROSURGICAL) ×1 IMPLANT
EVACUATOR DRAINAGE 10X20 100CC (DRAIN) IMPLANT
EVACUATOR SILICONE 100CC (DRAIN) ×3
GLOVE BIOGEL PI IND STRL 7.5 (GLOVE) ×1 IMPLANT
GLOVE BIOGEL PI INDICATOR 7.5 (GLOVE) ×2
GLOVE SS BIOGEL STRL SZ 7.5 (GLOVE) ×1 IMPLANT
GLOVE SUPERSENSE BIOGEL SZ 7.5 (GLOVE) ×2
GOWN STRL REUS W/TWL XL LVL3 (GOWN DISPOSABLE) ×12 IMPLANT
HEMOSTAT SNOW SURGICEL 2X4 (HEMOSTASIS) IMPLANT
HEMOSTAT SURGICEL 4X8 (HEMOSTASIS) ×2 IMPLANT
KIT BASIN OR (CUSTOM PROCEDURE TRAY) ×3 IMPLANT
MANIFOLD NEPTUNE II (INSTRUMENTS) ×2 IMPLANT
NS IRRIG 1000ML POUR BTL (IV SOLUTION) ×2 IMPLANT
POUCH SPECIMEN RETRIEVAL 10MM (ENDOMECHANICALS) ×2 IMPLANT
SCISSORS LAP 5X35 DISP (ENDOMECHANICALS) ×3 IMPLANT
SET CHOLANGIOGRAPH MIX (MISCELLANEOUS) ×3 IMPLANT
SET IRRIG TUBING LAPAROSCOPIC (IRRIGATION / IRRIGATOR) ×3 IMPLANT
SLEEVE XCEL OPT CAN 5 100 (ENDOMECHANICALS) ×3 IMPLANT
SOLUTION ANTI FOG 6CC (MISCELLANEOUS) ×3 IMPLANT
SUT ETHILON 2 0 PS N (SUTURE) ×2 IMPLANT
SUT MNCRL AB 4-0 PS2 18 (SUTURE) ×3 IMPLANT
TOWEL OR 17X26 10 PK STRL BLUE (TOWEL DISPOSABLE) ×3 IMPLANT
TOWEL OR NON WOVEN STRL DISP B (DISPOSABLE) ×3 IMPLANT
TRAY LAP CHOLE (CUSTOM PROCEDURE TRAY) ×3 IMPLANT
TROCAR BLADELESS OPT 5 100 (ENDOMECHANICALS) ×3 IMPLANT
TROCAR XCEL BLUNT TIP 100MML (ENDOMECHANICALS) ×3 IMPLANT
TROCAR XCEL NON-BLD 11X100MML (ENDOMECHANICALS) ×3 IMPLANT
TUBING INSUFFLATION 10FT LAP (TUBING) ×3 IMPLANT

## 2013-03-07 NOTE — Op Note (Signed)
Preoperative Diagnosis: gallstones and recent episode of gallstone pancreatitis   Postoprative Diagnosis: gallstones and recent episode of gallstone pancreatitis   Procedure: Procedure(s): LAPAROSCOPIC CHOLECYSTECTOMY WITH INTRAOPERATIVE CHOLANGIOGRAM   Surgeon: Excell Seltzer T   Assistants: Johnathan Hausen  Anesthesia:  General endotracheal anesthesia  Indications: patient is a 73 year old male the recent acute episode of severe epigastric abdominal pain evaluated out of town. Workup included a gallbladder ultrasound showing gallstones and there was biochemical evidence of acute pancreatitis. He had mildly elevated LFTs. His symptoms quickly resolved. He did have a similar episode a couple of years ago and small gallstones were noted on CT scan at that time. Is history of recommended proceeding with laparoscopic cholecystectomy with cholangiogram. We discussed the indications for the surgery and the risks extensively which have been detailed elsewhere.  Procedure Detail:  Patient was brought to the operating room, placed in the supine position on the operating table, and general endotracheal anesthesia induced. He received preoperative IV antibiotics. PAS port in place. The abdomen was widely sterilely prepped and draped. Patient time out was performed and correct procedure verified. Trocar sites were infiltrated with local anesthesia. A 1-1/2 cm incision was made at the umbilicus and dissection carried down to the midline fascia. This was incised transversely for 1 cm the peritoneum entered under direct vision. Mattress suture of 0 Vicryl the Hassan Trocar was placed and pneumoperitoneum established. Under direct vision an 11 mm trocar was placed subxiphoid and 25 mm trochars along the right subcostal margin. The gallbladder was somewhat distended and fatty and cased. The liver appeared slightly enlarged with a nutmeg appearance. The fundus was grasped and elevated up to the liver. The  fibrofatty tissue was stripped down off the gallbladder toward the porta hepatis. The gallbladder was quite redundant and floppy. As we worked down toward the porta hepatis there were somewhat extensive but fortunately filmy adhesions to the duodenum which were carefully bluntly swept away. As the dissection progressed distally close triangle was thoroughly dissected. The gallbladder was seen to taper down to the cystic duct. We dissected the medial wall the gallbladder above this and obtained a good critical view. The cystic duct was encircled at the gallbladder junction. There was quite a bit of chronic inflammation of the tissue was fairly friable. The cystic duct was clipped at the gallbladder junction and an operative cholangiogram obtained through the cystic duct. This showed good filling of a mildly dilated common bile duct with free flow to the duodenum and no filling defects and good flow up into the hepatic ducts. Following this the Cholangiocath was removed and the cystic duct was doubly clipped proximally and divided. It was fairly short and again somewhat friable but appeared securely closed. Further dissection just behind the cystic duct clearly identified a posterior branch of the cystic artery coursing up to the gallbladder which was divided between 2 proximal and one distal clip. The gallbladder was then dissected free from its bed using hook cautery. Was placed in an Endo Catch bag and removed. Hemostasis was obtained in the gallbladder bed using cautery and a Surgicel pack. Due to the chronic inflammation and suboptimal quality the cystic duct I elected to place a close suction drain subhepatic and this was brought out through a lateral trocar site. The abdomen was inspected there was no evidence of bleeding or trocar injury or other problem. All CO2 was evacuated and trochars removed. Skin incisions were closed with subcuticular Monocryl and Dermabond.    Findings: Chronically inflamed  gallbladder. Mildly dilated common bile duct without filling defects or obstruction  Estimated Blood Loss:  less than 100 mL         Drains: 19 round Blake in the subhepatic space  Blood Given: none          Specimens: gallbladder        Complications:  * No complications entered in OR log *         Disposition: PACU - hemodynamically stable.         Condition: stable

## 2013-03-07 NOTE — Interval H&P Note (Signed)
History and Physical Interval Note:  03/07/2013 2:37 PM  Bryan Rivera  has presented today for surgery, with the diagnosis of gallstones and recent episode of gallstone pancreatitis     The various methods of treatment have been discussed with the patient and family. After consideration of risks, benefits and other options for treatment, the patient has consented to  Procedure(s): LAPAROSCOPIC CHOLECYSTECTOMY WITH INTRAOPERATIVE CHOLANGIOGRAM (N/A) as a surgical intervention .  The patient's history has been reviewed, patient examined, no change in status, stable for surgery.  I have reviewed the patient's chart and labs.  Questions were answered to the patient's satisfaction.     Gehrig Patras T

## 2013-03-07 NOTE — Preoperative (Signed)
Beta Blockers   Reason not to administer Beta Blockers:Not Applicable, not on home BB 

## 2013-03-07 NOTE — Anesthesia Postprocedure Evaluation (Signed)
  Anesthesia Post-op Note  Patient: Bryan Rivera  Procedure(s) Performed: Procedure(s) (LRB): LAPAROSCOPIC CHOLECYSTECTOMY WITH INTRAOPERATIVE CHOLANGIOGRAM (N/A)  Patient Location: PACU  Anesthesia Type: General  Level of Consciousness: awake and alert   Airway and Oxygen Therapy: Patient Spontanous Breathing  Post-op Pain: mild  Post-op Assessment: Post-op Vital signs reviewed, Patient's Cardiovascular Status Stable, Respiratory Function Stable, Patent Airway and No signs of Nausea or vomiting  Last Vitals:  Filed Vitals:   03/07/13 1730  BP: 155/79  Pulse: 64  Temp: 36.4 C  Resp: 12    Post-op Vital Signs: stable   Complications: No apparent anesthesia complications

## 2013-03-07 NOTE — H&P (View-Only) (Signed)
Subjective:   abdominal pain, gallstones  Patient ID: Bryan Rivera, male   DOB: May 01, 1940, 73 y.o.   MRN: [id number]  HPI Patient is a very pleasant 73 year old male referred by Dr. Jeanmarie Hubert. He was in his usual state of health until one week ago when while driving to [facility] to see his daughter he developed the onset of upper abdominal pressure which over the next hour or so developed and a significant pressure-like diffuse upper abdominal pain. This was associated with nausea and vomiting. His symptoms worsened and they went to the emergency room in [place]. There he had a workup including lab work, CT scan and ultrasound of the abdomen. I have all this to review today.CT scan showed some fatty infiltration of the liver, mildly prominent common bile duct at 9 mm without filling defects. Ultrasound showed gallbladder sludge and probable tiny stones without biliary ductal dilatation. Of note is the patient had a similar episode of pain in 2013 and CT scan of the abdomen and pelvis was done at that time revealing small gallstones. His pain resolved quickly in the emergency room one week ago after pain medication and since then he has felt well with no abdominal pain or nausea, fever, jaundice or other complaints.  Past Medical History  Diagnosis Date  . Diabetes mellitus   . Hyperlipidemia   . Hypertension   . 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 Diverticulosis   . Hemorrhoids    Past Surgical History  Procedure Laterality Date  . Middle ear surgery      multiple times  . Tonsillectomy and adenoidectomy    . Knee surgery     Current Outpatient Prescriptions  Medication Sig Dispense Refill  . cetirizine (ZYRTEC) 10 MG tablet Take 10 mg by mouth daily as needed. As needed for allergies.      . clobetasol (OLUX) 0.05 % topical foam Apply 1 application topically daily.       . metFORMIN (GLUCOPHAGE) 850 MG tablet Take 850 mg by mouth 2 (two)  times daily with a meal.      . ONGLYZA 5 MG TABS tablet       . OVER THE COUNTER MEDICATION Take 1 tablet by mouth 2 (two) times daily as needed. Allergy Relief. As needed for allergy relief.      . ramipril (ALTACE) 10 MG capsule Take 10 mg by mouth daily.      . simvastatin (ZOCOR) 40 MG tablet Take 40 mg by mouth every evening.      Marland Kitchen HYDROcodone-acetaminophen (NORCO/VICODIN) 5-325 MG per tablet       . ondansetron (ZOFRAN-ODT) 4 MG disintegrating tablet        No current facility-administered medications for this visit.   Allergies  Allergen Reactions  . Sulfa Antibiotics Swelling    Review of Systems Gen.: No fever or chills or malaise Lungs: Denies shortness of breath cough wheezing Cardiac: Denies chest pain, palpitations or history of heart disease Abdomen: As above GU: No hematuria Musculoskeletal: Chronic joint pain and arthritis    Objective:   Physical Exam BP 140/80  Pulse 120  Temp(Src) 98.2 F (36.8 C) (Temporal)  Resp 14  Ht 5\' 9"  (1.753 m)  Wt 162 lb 6.4 oz (73.664 kg)  BMI 23.97 kg/m2 General: Alert,  thin, slightly anxious Caucasian male in no distress Skin: Warm and dry. Mild papular rash over her trunk. HEENT: No palpable masses or  thyromegaly. Sclera nonicteric. Pupils equal round and reactive. Oropharynx clear. Lymph nodes: No cervical, supraclavicular, or inguinal nodes palpable. Lungs: Breath sounds clear and equal without increased work of breathing Cardiovascular: Regular rate and rhythm without murmur. No JVD or edema. Peripheral pulses intact. Abdomen: Nondistended. Soft and nontender. No masses palpable. No organomegaly. No palpable hernias. Extremities: No edema or joint swelling or deformity. No chronic venous stasis changes. Neurologic: Alert and fully oriented. Gait normal.    Assessment:     Recent episode of severe epigastric abdominal pain and vomiting. He also had a similar episode in 2013. He has small gallstones and chemical  evidence of gallstone pancreatitis with mildly elevated LFTs. He is currently in tightly asymptomatic. I suspect he passed a common bile duct stone. I have recommended proceeding with laparoscopic cholecystectomy with cholangiogram to prevent further symptoms and prevent complications from his gallstones.I discussed the procedure in detail.  The patient was given Neurosurgeon.  We discussed the risks and benefits of a laparoscopic cholecystectomy and possible cholangiogram including, but not limited to bleeding, infection, injury to surrounding structures such as the intestine or liver, bile leak, retained gallstones, need to convert to an open procedure, prolonged diarrhea, blood clots such as  DVT, common bile duct injury, anesthesia risks, and possible need for additional procedures.  The likelihood of improvement in symptoms and return to the patient's normal status is good. We discussed the typical post-operative recovery course.     Plan:     Laparoscopic cholecystectomy with cholangiogram under general anesthesia likely as an outpatient. Repeat LFTs and lipase preoperatively.

## 2013-03-07 NOTE — Anesthesia Preprocedure Evaluation (Addendum)
Anesthesia Evaluation  Patient identified by MRN, date of birth, ID band Patient awake    Reviewed: Allergy & Precautions, H&P , NPO status , Patient's Chart, lab work & pertinent test results  History of Anesthesia Complications (+) DIFFICULT AIRWAY  Airway Mallampati: II TM Distance: >3 FB Neck ROM: Full    Dental no notable dental hx.    Pulmonary former smoker,  breath sounds clear to auscultation  Pulmonary exam normal       Cardiovascular Exercise Tolerance: Good hypertension, Pt. on medications Rhythm:Regular Rate:Normal     Neuro/Psych negative neurological ROS  negative psych ROS   GI/Hepatic negative GI ROS, Neg liver ROS,   Endo/Other  diabetes, Type 2, Oral Hypoglycemic Agents  Renal/GU negative Renal ROS  negative genitourinary   Musculoskeletal negative musculoskeletal ROS (+)   Abdominal   Peds negative pediatric ROS (+)  Hematology negative hematology ROS (+)   Anesthesia Other Findings   Reproductive/Obstetrics negative OB ROS                          Anesthesia Physical Anesthesia Plan  ASA: II  Anesthesia Plan: General   Post-op Pain Management:    Induction: Intravenous  Airway Management Planned: Oral ETT  Additional Equipment:   Intra-op Plan:   Post-operative Plan: Extubation in OR  Informed Consent: I have reviewed the patients History and Physical, chart, labs and discussed the procedure including the risks, benefits and alternatives for the proposed anesthesia with the patient or authorized representative who has indicated his/her understanding and acceptance.   Dental advisory given  Plan Discussed with: CRNA  Anesthesia Plan Comments: (He had a procedure at [facility] type facility many years ago and received a letter from anesthesia stating that he needed a "smaller breathing tube". Glide scope available.)       Anesthesia Quick  Evaluation

## 2013-03-07 NOTE — Transfer of Care (Signed)
Immediate Anesthesia Transfer of Care Note  Patient: Bryan Rivera  Procedure(s) Performed: Procedure(s) (LRB): LAPAROSCOPIC CHOLECYSTECTOMY WITH INTRAOPERATIVE CHOLANGIOGRAM (N/A)  Patient Location: PACU  Anesthesia Type: General  Level of Consciousness: sedated, patient cooperative and responds to stimulation  Airway & Oxygen Therapy: Patient Spontanous Breathing and Patient connected to face mask oxgen  Post-op Assessment: Report given to PACU RN and Post -op Vital signs reviewed and stable  Post vital signs: Reviewed and stable  Complications: No apparent anesthesia complications

## 2013-03-08 ENCOUNTER — Encounter (HOSPITAL_COMMUNITY): Payer: Self-pay | Admitting: General Surgery

## 2013-03-08 ENCOUNTER — Telehealth (INDEPENDENT_AMBULATORY_CARE_PROVIDER_SITE_OTHER): Payer: Self-pay

## 2013-03-08 LAB — COMPREHENSIVE METABOLIC PANEL
ALT: 88 U/L — ABNORMAL HIGH (ref 0–53)
AST: 155 U/L — ABNORMAL HIGH (ref 0–37)
Albumin: 3.4 g/dL — ABNORMAL LOW (ref 3.5–5.2)
Alkaline Phosphatase: 75 U/L (ref 39–117)
BILIRUBIN TOTAL: 0.7 mg/dL (ref 0.3–1.2)
BUN: 19 mg/dL (ref 6–23)
CO2: 19 mEq/L (ref 19–32)
Calcium: 8.2 mg/dL — ABNORMAL LOW (ref 8.4–10.5)
Chloride: 99 mEq/L (ref 96–112)
Creatinine, Ser: 1.33 mg/dL (ref 0.50–1.35)
GFR calc non Af Amer: 52 mL/min — ABNORMAL LOW (ref >90)
GFR, EST AFRICAN AMERICAN: 60 mL/min — AB (ref >90)
Glucose, Bld: 223 mg/dL — ABNORMAL HIGH (ref 70–99)
Potassium: 5.7 mEq/L — ABNORMAL HIGH (ref 3.7–5.3)
Sodium: 134 mEq/L — ABNORMAL LOW (ref 137–147)
TOTAL PROTEIN: 6.2 g/dL (ref 6.0–8.3)

## 2013-03-08 LAB — CBC
HEMATOCRIT: 38.8 % — AB (ref 39.0–52.0)
Hemoglobin: 12.9 g/dL — ABNORMAL LOW (ref 13.0–17.0)
MCH: 31.9 pg (ref 26.0–34.0)
MCHC: 33.2 g/dL (ref 30.0–36.0)
MCV: 96 fL (ref 78.0–100.0)
Platelets: 183 10*3/uL (ref 150–400)
RBC: 4.04 MIL/uL — ABNORMAL LOW (ref 4.22–5.81)
RDW: 13.1 % (ref 11.5–15.5)
WBC: 9.8 10*3/uL (ref 4.0–10.5)

## 2013-03-08 LAB — GLUCOSE, CAPILLARY: Glucose-Capillary: 164 mg/dL — ABNORMAL HIGH (ref 70–99)

## 2013-03-08 NOTE — Telephone Encounter (Signed)
Called and spoke to patient to make aware of appointment for 03/10/13 @ 10:00 am w/Dr.Hoxworth.

## 2013-03-08 NOTE — Plan of Care (Signed)
Problem: Discharge Progression Outcomes Goal: Other Discharge Outcomes/Goals Outcome: Completed/Met Date Met:  03/08/13 Discharged to wife to go home

## 2013-03-08 NOTE — Plan of Care (Signed)
Problem: Discharge Progression Outcomes Goal: Tubes and drains discontinued if indicated Outcome: Adequate for Discharge Will see Dr. Hunt Oris 1/30 in his office to have his drain removed.

## 2013-03-08 NOTE — Discharge Instructions (Signed)
[facility] ______[facility] °LAPAROSCOPIC SURGERY: POST OP INSTRUCTIONS °Always review your discharge instruction sheet given to you by the facility where your surgery was performed. °IF YOU HAVE DISABILITY OR FAMILY LEAVE FORMS, YOU MUST BRING THEM TO THE OFFICE FOR PROCESSING.   °DO NOT GIVE THEM TO YOUR DOCTOR. ° °1. A prescription for pain medication may be given to you upon discharge.  Take your pain medication as prescribed, if needed.  If narcotic pain medicine is not needed, then you may take acetaminophen (Tylenol) or ibuprofen (Advil) as needed. °2. Take your usually prescribed medications unless otherwise directed. °3. If you need a refill on your pain medication, please contact your pharmacy.  They will contact our office to request authorization. Prescriptions will not be filled after 5pm or on week-ends. °4. You should follow a light diet the first few days after arrival home, such as soup and crackers, etc.  Be sure to include lots of fluids daily. °5. Most patients will experience some swelling and bruising in the area of the incisions.  Ice packs will help.  Swelling and bruising can take several days to resolve.  °6. It is common to experience some constipation if taking pain medication after surgery.  Increasing fluid intake and taking a stool softener (such as Colace) will usually help or prevent this problem from occurring.  A mild laxative (Milk of Magnesia or Miralax) should be taken according to package instructions if there are no bowel movements after 48 hours. °7. Unless discharge instructions indicate otherwise, you may remove your bandages 24-48 hours after surgery, and you may shower at that time.  You may have steri-strips (small skin tapes) in place directly over the incision.  These strips should be left on the skin for 7-10 days.  If your surgeon used skin glue on the incision, you may shower in 24 hours.  The glue will flake off over the next 2-3 weeks.  Any sutures or  staples will be removed at the office during your follow-up visit. °8. ACTIVITIES:  You may resume regular (light) daily activities beginning the next day--such as daily self-care, walking, climbing stairs--gradually increasing activities as tolerated.  You may have sexual intercourse when it is comfortable.  Refrain from any heavy lifting or straining until approved by your doctor. °a. You may drive when you are no longer taking prescription pain medication, you can comfortably wear a seatbelt, and you can safely maneuver your car and apply brakes. °b. RETURN TO WORK:  __________________________________________________________ °9. You should see your doctor in the office for a follow-up appointment approximately 2-3 weeks after your surgery.  Make sure that you call for this appointment within a day or two after you arrive home to insure a convenient appointment time. °10. OTHER INSTRUCTIONS: __________________________________________________________________________________________________________________________ __________________________________________________________________________________________________________________________ °WHEN TO CALL YOUR DOCTOR: °1. Fever over 101.0 °2. Inability to urinate °3. Continued bleeding from incision. °4. Increased pain, redness, or drainage from the incision. °5. Increasing abdominal pain ° °The clinic staff is available to answer your questions during regular business hours.  Please don’t hesitate to call and ask to speak to one of the nurses for clinical concerns.  If you have a medical emergency, go to the nearest emergency room or call 911.  A surgeon from [facility] is always on call at the hospital. °[address], [address], [place], [place]  [address] ? [address] [address], [place], [place]   [address] °[phone] ? [phone] ? FAX [phone] °Web site:   [email or web address] °

## 2013-03-08 NOTE — Discharge Summary (Signed)
   Patient ID: Bryan Rivera [id number] y.o. 1941-01-07  03/07/2013  Discharge date and time: 03/08/2013   Admitting Physician: Excell Seltzer T  Discharge Physician: Excell Seltzer T  Admission Diagnoses: gallstones and recent episode of gallstone pancreatitis   Discharge Diagnoses: same  Operations: Procedure(s): LAPAROSCOPIC CHOLECYSTECTOMY WITH INTRAOPERATIVE CHOLANGIOGRAM  Admission Condition: good  Discharged Condition: good  Indication for Admission: patient is a 73 year old male who approximately one week prior to this admission developed acute severe epigastric abdominal pain while he was out of town. Workup including lab work showing evidence of pancreatitis and mildly elevated LFTs an ultrasound showing gallstones. He was seen in the office with resolution of his symptoms and I recommended proceeding with laparoscopic cholecystectomy with cholangiogram. The patient was admitted for observation overnight following this procedure.  Hospital Course: on the day of admission the patient underwent laparoscopic cholecystectomy with intraoperative cholangiogram. He had some significant chronic inflammation of his gallbladder. Operative cholangiogram was normal. A JP drain was left in place. He tolerated the procedure well. On the morning following the procedure he has some abdominal soreness but no severe pain and tolerating liquid diet well. Abdomen is soft with minimal tenderness. JP drainage is serosanguineous. He has a moderately elevated transaminases on his lab work. CBC is unremarkable. He is felt ready for discharge. Return to the office for drain removal in 2 days and we will follow his LFTs as an outpatient.  Disposition: Home  Patient Instructions:    Medication List         cetirizine 10 MG tablet  Commonly known as:  ZYRTEC  Take 10 mg by mouth daily as needed. As needed for allergies.     clobetasol 0.05 % topical foam  Commonly known as:  OLUX  Apply 1  application topically daily.     HYDROcodone-acetaminophen 5-325 MG per tablet  Commonly known as:  NORCO/VICODIN  Take 1 tablet by mouth every 6 (six) hours as needed for moderate pain.     naproxen sodium 220 MG tablet  Commonly known as:  ANAPROX  Take 440 mg by mouth 2 (two) times daily as needed (pain).     ondansetron 4 MG disintegrating tablet  Commonly known as:  ZOFRAN-ODT  Take 4 mg by mouth every 8 (eight) hours as needed for nausea.     ONGLYZA 5 MG Tabs tablet  Generic drug:  saxagliptin HCl  Take 5 mg by mouth every morning.     ramipril 10 MG capsule  Commonly known as:  ALTACE  Take 10 mg by mouth every morning.     simvastatin 40 MG tablet  Commonly known as:  ZOCOR  Take 40 mg by mouth every evening.        Activity: activity as tolerated Diet: regular diet Wound Care: empty JP drain daily and record amount  Follow-up:  With Dr. Excell Seltzer in 2 days.  Signed: Edward Jolly MD, FACS  03/08/2013, 8:38 AM

## 2013-03-08 NOTE — Progress Notes (Signed)
Pt refused his medications states he will take them when he gets home. Discharge instructions explained to pt and his wife. Dr. Excell Seltzer in and told pt to make an appointment for Friday to have his drain removed.

## 2013-03-10 ENCOUNTER — Encounter (INDEPENDENT_AMBULATORY_CARE_PROVIDER_SITE_OTHER): Payer: Self-pay | Admitting: General Surgery

## 2013-03-10 ENCOUNTER — Ambulatory Visit (INDEPENDENT_AMBULATORY_CARE_PROVIDER_SITE_OTHER): Payer: Medicare Other | Admitting: General Surgery

## 2013-03-10 VITALS — BP 128/74 | HR 112 | Temp 98.5°F | Resp 16 | Ht 69.0 in | Wt 160.0 lb

## 2013-03-10 DIAGNOSIS — K8020 Calculus of gallbladder without cholecystitis without obstruction: Secondary | ICD-10-CM

## 2013-03-10 NOTE — Progress Notes (Signed)
Chief complaint: Follow up cholecystectomy  History: Patient returns for followup for laparoscopic cholecystectomy 3 days ago. He has a history of apparent recent brief episode of gallstone pancreatitis. At the time of surgery in the normal cholangiogram with significant chronic inflammation of the gallbladder and a drain was left in place. He says he felt very weak and so were the first day but has felt quite well since. Denies pain or vomiting or other complaints.  Exam: BP 128/74  Pulse 112  Temp(Src) 98.5 F (36.9 C) (Temporal)  Resp 16  Ht 5\' 9"  (1.753 m)  Wt 160 lb (72.576 kg)  BMI 23.62 kg/m2 General: Does not appear ill Abdomen: Soft and nontender. JP drainage is serous sanguinous and this was removed.  Pathology showed cholelithiasis and cholecystitis  Assessment and plan: Doing well following laparoscopic cholecystectomy without apparent complication. We reviewed and activity limitations LC him back in 3 weeks.

## 2013-04-06 ENCOUNTER — Encounter (INDEPENDENT_AMBULATORY_CARE_PROVIDER_SITE_OTHER): Payer: Medicare Other | Admitting: General Surgery

## 2013-05-11 ENCOUNTER — Ambulatory Visit (INDEPENDENT_AMBULATORY_CARE_PROVIDER_SITE_OTHER): Payer: Medicare Other | Admitting: General Surgery

## 2013-05-11 ENCOUNTER — Encounter (INDEPENDENT_AMBULATORY_CARE_PROVIDER_SITE_OTHER): Payer: Self-pay | Admitting: General Surgery

## 2013-05-11 VITALS — BP 126/78 | HR 75 | Temp 97.5°F | Ht 69.0 in | Wt 161.8 lb

## 2013-05-11 DIAGNOSIS — K8020 Calculus of gallbladder without cholecystitis without obstruction: Secondary | ICD-10-CM

## 2013-05-11 NOTE — Progress Notes (Signed)
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 At this point he feels well with no abdominal pain. Appetite is excellent. Back to routine activities.  Exam: BP 126/78  Pulse 75  Temp(Src) 97.5 F (36.4 C) (Oral)  Ht 5\' 9"  (1.753 m)  Wt 161 lb 12.8 oz (73.392 kg)  BMI 23.88 kg/m2 General: Appears well Abdomen: Soft and nontender with well-healed  Assessment and plan: Doing well follow up scopic cholecystectomy with relief of symptoms. He is discharged return as needed.

## 2013-05-29 ENCOUNTER — Other Ambulatory Visit: Payer: Self-pay | Admitting: Dermatology

## 2013-06-22 ENCOUNTER — Other Ambulatory Visit: Payer: Self-pay | Admitting: Dermatology

## 2013-07-12 ENCOUNTER — Other Ambulatory Visit: Payer: Self-pay | Admitting: Dermatology

## 2013-12-18 ENCOUNTER — Other Ambulatory Visit: Payer: Self-pay | Admitting: Dermatology

## 2014-02-12 ENCOUNTER — Other Ambulatory Visit: Payer: Self-pay | Admitting: Dermatology

## 2014-04-03 ENCOUNTER — Other Ambulatory Visit: Payer: Self-pay | Admitting: Internal Medicine

## 2014-04-03 ENCOUNTER — Ambulatory Visit
Admission: RE | Admit: 2014-04-03 | Discharge: 2014-04-03 | Disposition: A | Discharge status: Home / Self Care | Payer: Medicare Other | Source: Ambulatory Visit | Attending: Internal Medicine | Admitting: Internal Medicine

## 2014-04-03 DIAGNOSIS — Z01818 Encounter for other preprocedural examination: Secondary | ICD-10-CM

## 2014-06-11 ENCOUNTER — Other Ambulatory Visit: Payer: Self-pay | Admitting: Otolaryngology

## 2016-03-26 ENCOUNTER — Encounter: Payer: Self-pay | Admitting: Gastroenterology

## 2018-11-10 ENCOUNTER — Other Ambulatory Visit: Payer: Self-pay | Admitting: Nephrology

## 2018-11-10 DIAGNOSIS — N183 Chronic kidney disease, stage 3 unspecified: Secondary | ICD-10-CM

## 2018-11-17 ENCOUNTER — Ambulatory Visit
Admission: RE | Admit: 2018-11-17 | Discharge: 2018-11-17 | Disposition: A | Discharge status: Home / Self Care | Payer: Medicare Other | Source: Ambulatory Visit | Attending: Nephrology | Admitting: Nephrology

## 2018-11-17 DIAGNOSIS — N183 Chronic kidney disease, stage 3 unspecified: Secondary | ICD-10-CM

## 2020-04-03 IMAGING — US US RENAL
1 series · 14 of 25 positions shown · non-contrast
Comparison: CT study 07/21/2011

CLINICAL DATA: CKD stage 3

EXAM:
RENAL / URINARY TRACT ULTRASOUND COMPLETE

[Series 1: us renal · 0.23mm/px · 14 of 40 slices shown]
[im 1/40]
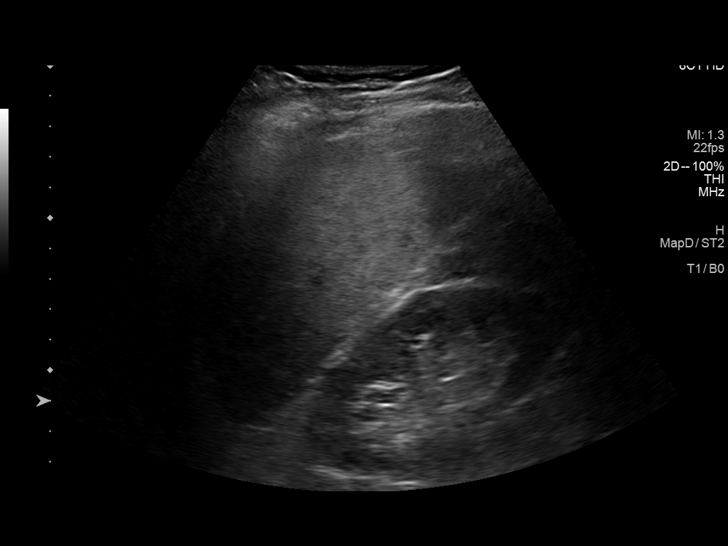
[im 4/40]
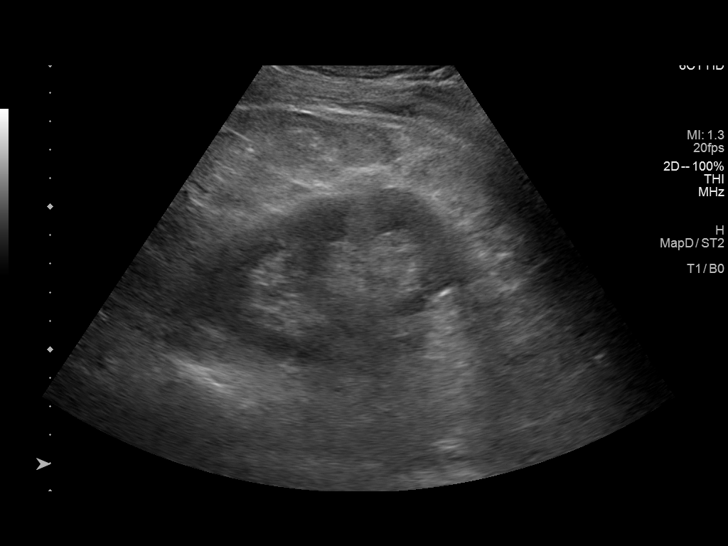
[im 7/40]
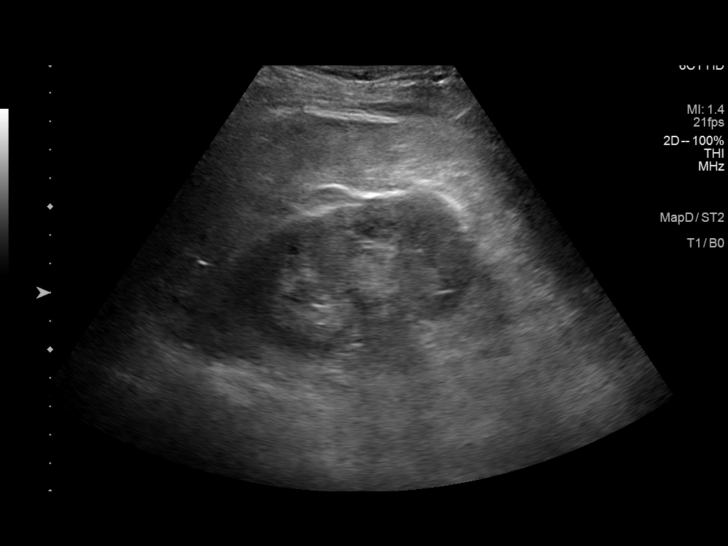
[im 10/40]
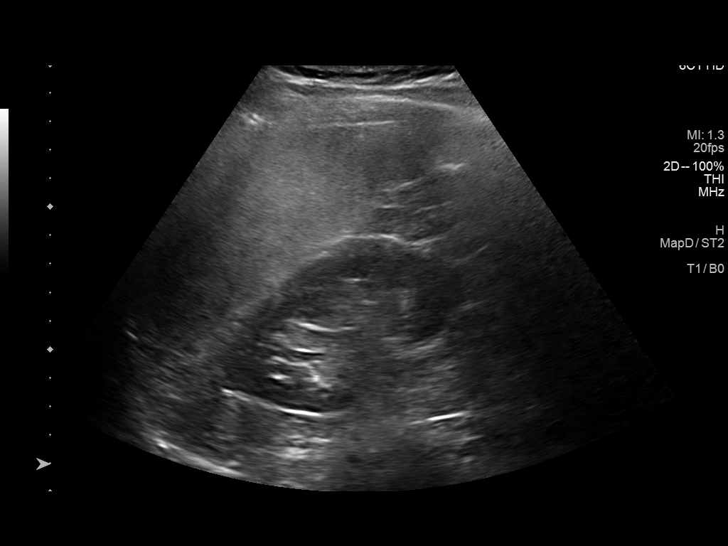
[im 14/40]
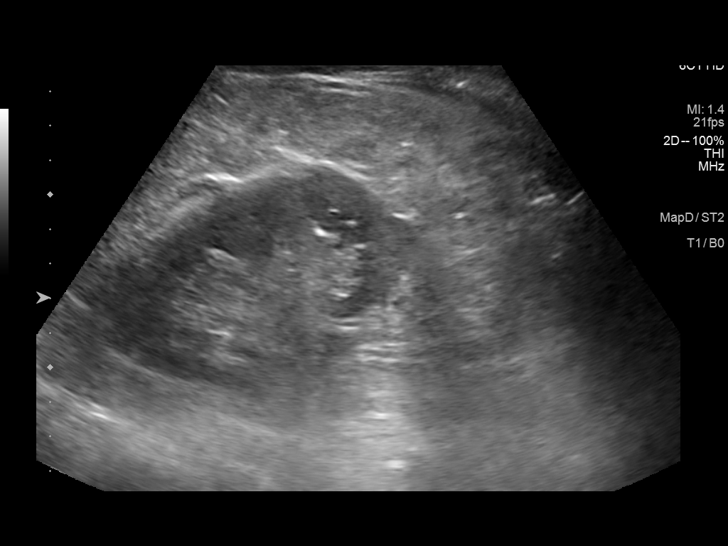
[im 15/40]
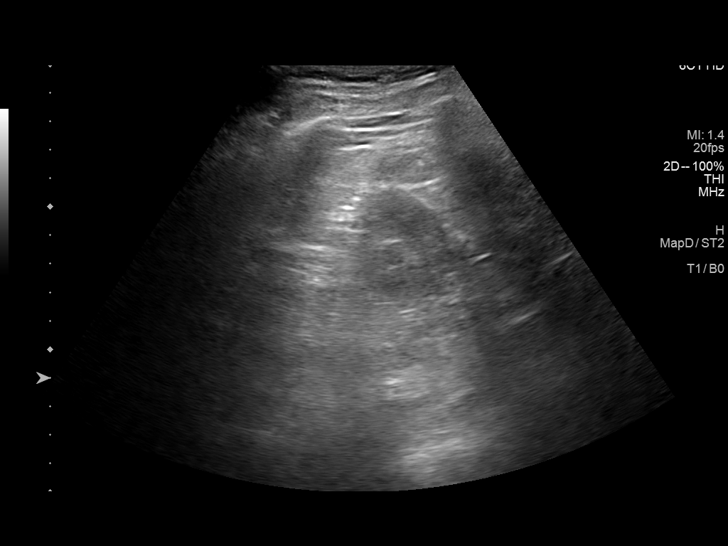
[im 18/40]
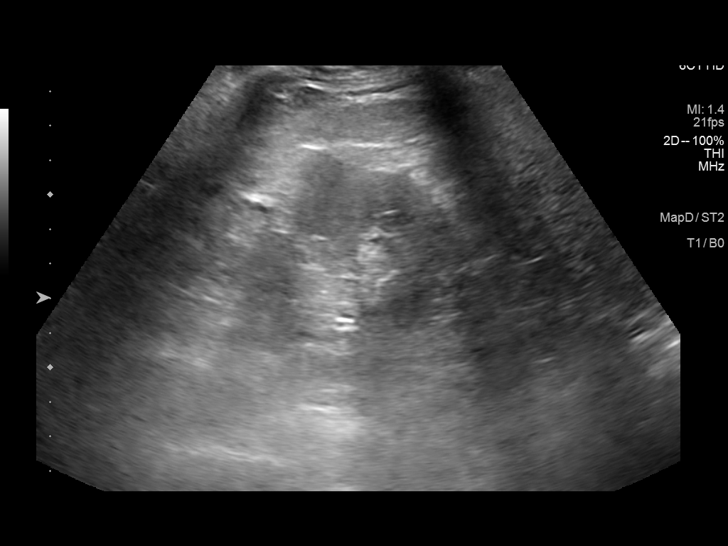
[im 22/40]
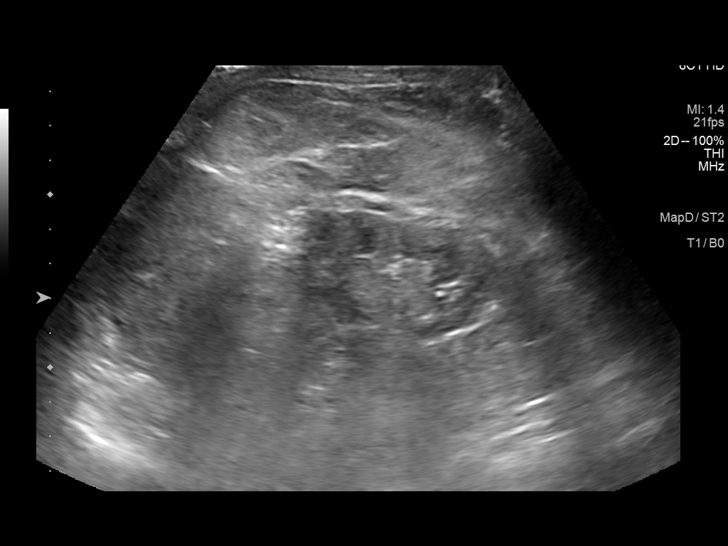
[im 25/40]
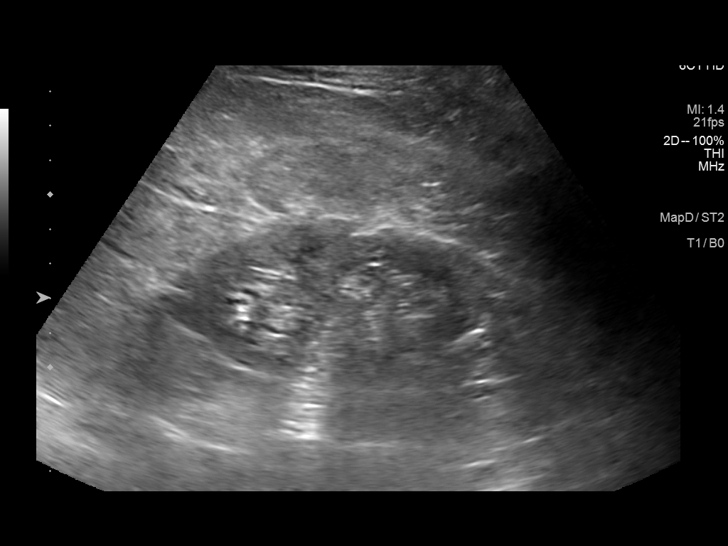
[im 27/40]
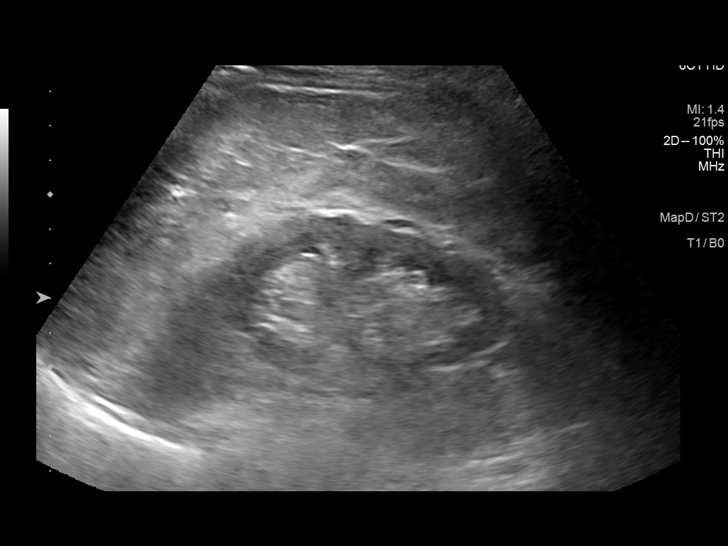
[im 30/40]
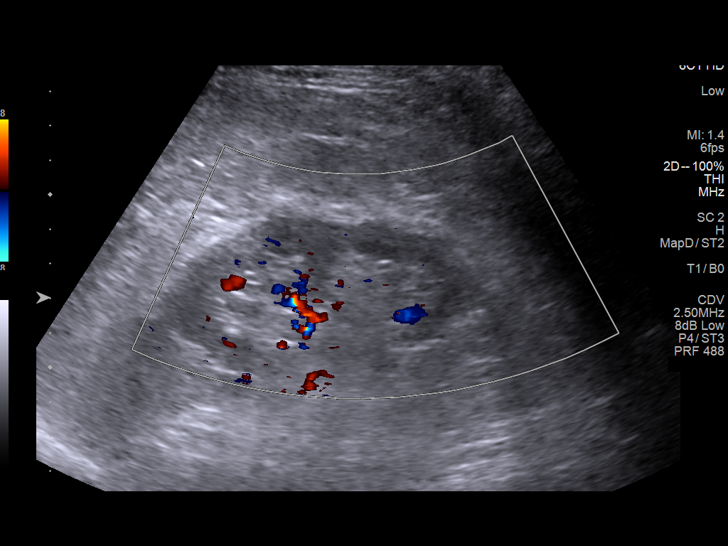
[im 33/40]
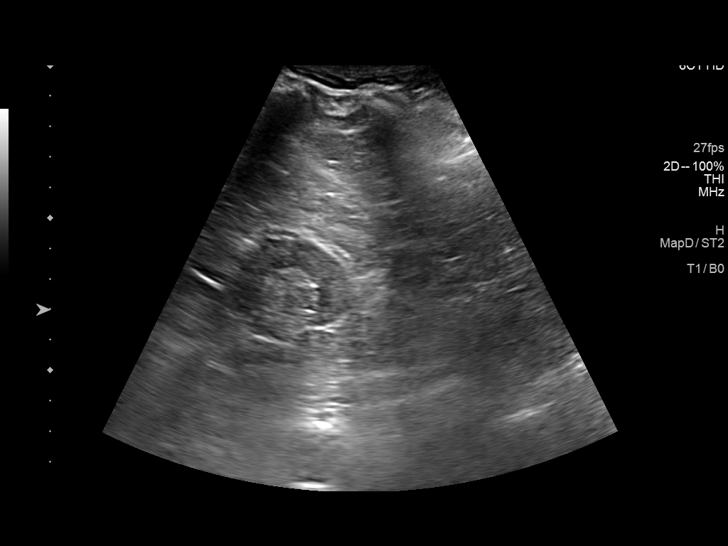
[im 36/40]
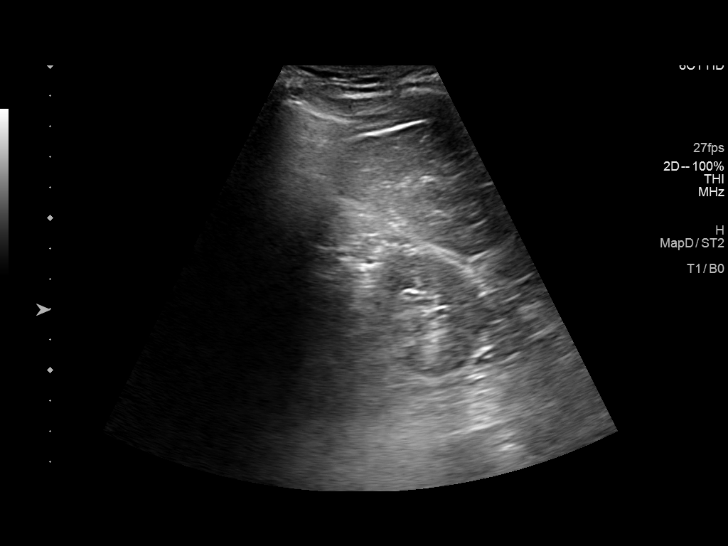
[im 40/40]
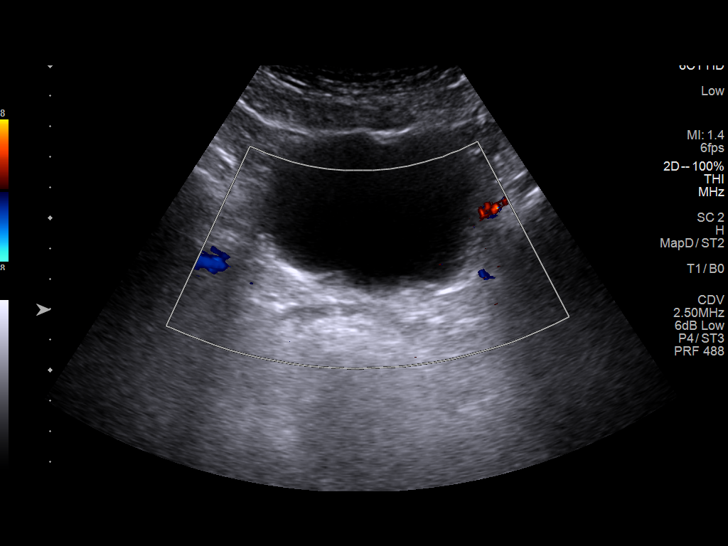

[14 of 25 positions shown; findings below may reference images not displayed]

FINDINGS: Right Kidney:

Renal measurements: 9.8 x 5.1 x 6.3 cm = volume: 164 mL. Mild
cortical thinning, no signs of mass, cyst or calculus.

Left Kidney:

Renal measurements: 9.4 x 4.5 x 4.3 cm = volume: 93 mL. Mild
cortical thinning without hydronephrosis, cyst or calculus.

Bladder:

Appears normal for degree of bladder distention.
IMPRESSION: Mild cortical thinning, otherwise negative renal sonogram.

## 2020-11-09 DEATH — deceased
